# Patient Record
Sex: Female | Born: 1965 | Race: White | Hispanic: No | Marital: Married | Smoking: Former smoker
Health system: Southern US, Community
[De-identification: ages and names within clinical notes are randomized; demographics above are authoritative.]

## PROBLEM LIST (undated history)

## (undated) DIAGNOSIS — M1990 Unspecified osteoarthritis, unspecified site: Secondary | ICD-10-CM

## (undated) DIAGNOSIS — I10 Essential (primary) hypertension: Secondary | ICD-10-CM

## (undated) DIAGNOSIS — R011 Cardiac murmur, unspecified: Secondary | ICD-10-CM

## (undated) DIAGNOSIS — K589 Irritable bowel syndrome without diarrhea: Secondary | ICD-10-CM

## (undated) DIAGNOSIS — T8859XA Other complications of anesthesia, initial encounter: Secondary | ICD-10-CM

## (undated) DIAGNOSIS — K219 Gastro-esophageal reflux disease without esophagitis: Secondary | ICD-10-CM

## (undated) DIAGNOSIS — Z9889 Other specified postprocedural states: Secondary | ICD-10-CM

## (undated) DIAGNOSIS — F329 Major depressive disorder, single episode, unspecified: Secondary | ICD-10-CM

## (undated) DIAGNOSIS — R112 Nausea with vomiting, unspecified: Secondary | ICD-10-CM

## (undated) DIAGNOSIS — F32A Depression, unspecified: Secondary | ICD-10-CM

## (undated) DIAGNOSIS — J189 Pneumonia, unspecified organism: Secondary | ICD-10-CM

## (undated) DIAGNOSIS — F419 Anxiety disorder, unspecified: Secondary | ICD-10-CM

## (undated) DIAGNOSIS — Z8489 Family history of other specified conditions: Secondary | ICD-10-CM

## (undated) DIAGNOSIS — T4145XA Adverse effect of unspecified anesthetic, initial encounter: Secondary | ICD-10-CM

## (undated) HISTORY — PX: BREAST SURGERY: SHX581

## (undated) HISTORY — PX: OTHER SURGICAL HISTORY: SHX169

## (undated) HISTORY — PX: DILATION AND CURETTAGE OF UTERUS: SHX78

---

## 2001-01-02 ENCOUNTER — Other Ambulatory Visit: Admission: RE | Admit: 2001-01-02 | Discharge: 2001-01-02 | Payer: Self-pay | Admitting: Obstetrics and Gynecology

## 2001-11-14 ENCOUNTER — Other Ambulatory Visit: Admission: RE | Admit: 2001-11-14 | Discharge: 2001-11-14 | Payer: Self-pay | Admitting: Obstetrics and Gynecology

## 2002-04-12 ENCOUNTER — Inpatient Hospital Stay (HOSPITAL_COMMUNITY): Admission: AD | Admit: 2002-04-12 | Discharge: 2002-04-12 | Payer: Self-pay | Admitting: Obstetrics and Gynecology

## 2002-04-12 ENCOUNTER — Encounter: Payer: Self-pay | Admitting: Obstetrics and Gynecology

## 2002-04-16 ENCOUNTER — Inpatient Hospital Stay (HOSPITAL_COMMUNITY): Admission: AD | Admit: 2002-04-16 | Discharge: 2002-04-16 | Payer: Self-pay | Admitting: Obstetrics and Gynecology

## 2002-05-11 ENCOUNTER — Inpatient Hospital Stay (HOSPITAL_COMMUNITY): Admission: AD | Admit: 2002-05-11 | Discharge: 2002-05-14 | Payer: Self-pay | Admitting: Obstetrics and Gynecology

## 2002-06-13 ENCOUNTER — Other Ambulatory Visit: Admission: RE | Admit: 2002-06-13 | Discharge: 2002-06-13 | Payer: Self-pay | Admitting: Obstetrics and Gynecology

## 2003-07-03 ENCOUNTER — Other Ambulatory Visit: Admission: RE | Admit: 2003-07-03 | Discharge: 2003-07-03 | Payer: Self-pay | Admitting: Obstetrics and Gynecology

## 2003-11-06 ENCOUNTER — Encounter: Admission: RE | Admit: 2003-11-06 | Discharge: 2003-11-06 | Payer: Self-pay | Admitting: Obstetrics and Gynecology

## 2004-03-20 ENCOUNTER — Emergency Department (HOSPITAL_COMMUNITY): Admission: EM | Admit: 2004-03-20 | Discharge: 2004-03-21 | Payer: Self-pay | Admitting: Emergency Medicine

## 2004-09-21 ENCOUNTER — Other Ambulatory Visit: Admission: RE | Admit: 2004-09-21 | Discharge: 2004-09-21 | Payer: Self-pay | Admitting: Obstetrics and Gynecology

## 2004-11-09 ENCOUNTER — Encounter: Admission: RE | Admit: 2004-11-09 | Discharge: 2004-11-09 | Payer: Self-pay | Admitting: Obstetrics and Gynecology

## 2005-02-26 ENCOUNTER — Encounter: Admission: RE | Admit: 2005-02-26 | Discharge: 2005-02-26 | Payer: Self-pay | Admitting: Obstetrics and Gynecology

## 2005-07-12 ENCOUNTER — Emergency Department (HOSPITAL_COMMUNITY): Admission: EM | Admit: 2005-07-12 | Discharge: 2005-07-12 | Payer: Self-pay | Admitting: Emergency Medicine

## 2005-11-15 ENCOUNTER — Encounter: Admission: RE | Admit: 2005-11-15 | Discharge: 2005-11-15 | Payer: Self-pay | Admitting: Obstetrics and Gynecology

## 2007-02-08 ENCOUNTER — Encounter (INDEPENDENT_AMBULATORY_CARE_PROVIDER_SITE_OTHER): Payer: Self-pay | Admitting: *Deleted

## 2007-02-08 ENCOUNTER — Emergency Department (HOSPITAL_COMMUNITY): Admission: EM | Admit: 2007-02-08 | Discharge: 2007-02-08 | Payer: Self-pay | Admitting: Emergency Medicine

## 2007-03-21 ENCOUNTER — Encounter: Admission: RE | Admit: 2007-03-21 | Discharge: 2007-03-21 | Payer: Self-pay | Admitting: Obstetrics and Gynecology

## 2008-04-02 ENCOUNTER — Encounter: Admission: RE | Admit: 2008-04-02 | Discharge: 2008-04-02 | Payer: Self-pay | Admitting: Obstetrics and Gynecology

## 2008-10-29 DIAGNOSIS — F411 Generalized anxiety disorder: Secondary | ICD-10-CM | POA: Insufficient documentation

## 2008-10-29 DIAGNOSIS — F329 Major depressive disorder, single episode, unspecified: Secondary | ICD-10-CM

## 2008-10-29 DIAGNOSIS — F32A Depression, unspecified: Secondary | ICD-10-CM | POA: Insufficient documentation

## 2008-10-29 DIAGNOSIS — K589 Irritable bowel syndrome without diarrhea: Secondary | ICD-10-CM | POA: Insufficient documentation

## 2009-04-28 ENCOUNTER — Encounter: Admission: RE | Admit: 2009-04-28 | Discharge: 2009-04-28 | Payer: Self-pay | Admitting: Obstetrics and Gynecology

## 2009-09-28 ENCOUNTER — Emergency Department (HOSPITAL_COMMUNITY): Admission: EM | Admit: 2009-09-28 | Discharge: 2009-09-28 | Payer: Self-pay | Admitting: Emergency Medicine

## 2009-11-17 ENCOUNTER — Encounter: Admission: RE | Admit: 2009-11-17 | Discharge: 2009-11-17 | Payer: Self-pay | Admitting: Obstetrics and Gynecology

## 2009-11-18 ENCOUNTER — Encounter: Admission: RE | Admit: 2009-11-18 | Discharge: 2009-11-18 | Payer: Self-pay | Admitting: Obstetrics and Gynecology

## 2010-04-09 LAB — POCT PREGNANCY, URINE: Preg Test, Ur: NEGATIVE

## 2010-06-12 NOTE — Discharge Summary (Signed)
Joy Tucker, Joy Tucker                         ACCOUNT NO.:  [id number]   MEDICAL RECORD NO.:  [id number]                   PATIENT TYPE:  INP   LOCATION:  9104                                 FACILITY:  [facility]   PHYSICIAN:  Michelle L. Vincente Poli, M.D.            DATE OF BIRTH:  1966/01/11   DATE OF ADMISSION:  05/11/2002  DATE OF DISCHARGE:  05/14/2002                                 DISCHARGE SUMMARY   ADMISSION DIAGNOSES:  1. Intrauterine pregnancy at term.  2. Previous cesarean section, declined vaginal birth after cesarean section.  3. Mature lecithin/sphingo-myelin ratio.   DISCHARGE DIAGNOSES:  1. Status post low transverse cesarean section.  2. Viable female infant.   PROCEDURES:  Repeat low transverse cesarean section.   REASON FOR ADMISSION:  Please see the dictated H&P.   HOSPITAL COURSE:  The patient is a 45 year old married female, gravida 2,  para 1, who was admitted to the [facility] for a scheduled  cesarean delivery.  The patient had had a previous cesarean delivery for  cephalopelvic disproportion.  The patient had complained of significant low  back pain and preferred to proceed with a repeat cesarean section at 36-37  weeks.  Amniocentesis was performed to determine fetal lung maturity.  Lecithin/sphingo-myelin ratio revealed a 4.5:1 with prostaglandin.  The  patient was admitted therefore for repeat cesarean section.  On the morning  of admission, the patient was prepped accordingly and taken to the operating  room where spinal anesthesia was administered without difficulty.  A low  transverse cesarean section was made with delivery of a viable female infant  weighing 5 pounds 14 ounces with Apgars of 9 at one minute and 9 at five  minutes.  The patient tolerated the procedure well and was taken to the  recovery room in stable condition.  On postoperative day #1, the patient had  good return of bowel function.  The abdomen was soft.  The fundus  was firm  and nontender.  The abdominal dressing was removed, revealing an incision  that was clean, dry, and intact.  Labs revealed hemoglobin of 10.6, platelet  count of 205,000, and WBC count and 14.9.  On postoperative day #2, the  fundus was firm and nontender.  The incision was clean, dry, and intact with  ecchymosis noted superior to the incisional site.  The patient was  ambulating well and tolerating a regular diet without complaints of nausea  and vomiting.  On postoperative day #3, the patient was doing well.  The  abdomen was soft.  The fundus was firm and nontender.  The incision was  clean, dry, and intact.  Staples were removed and the patient was discharged  home.   CONDITION ON DISCHARGE:  Good.   DIET:  Regular as tolerated.   ACTIVITY:  No heavy lifting.  No driving x 2 weeks.  No vaginal entry.   FOLLOWUP:  The patient  is to follow up in the office in one to two weeks for  an incision check.  She is to call for a temperature greater than 100  degrees, persistent nausea and vomiting, heavy vaginal bleeding, and/or  redness of the incisional site.    DISCHARGE MEDICATIONS:  1. Darvocet-N 100, #30, one p.o. every four to six hours p.r.n.  2. Ibuprofen 600 mg every six hours.  3. Prenatal vitamins one p.o. daily.  4. Colace one p.o. daily p.r.n.     Julio Sicks, N.P.                        Stann Mainland. Vincente Poli, M.D.    CC/MEDQ  D:  06/19/2002  T:  06/19/2002  Job:  [id number]

## 2010-06-12 NOTE — Op Note (Signed)
NAMECORRI, DELAPAZ                         ACCOUNT NO.:  [id number]   MEDICAL RECORD NO.:  [id number]                   PATIENT TYPE:  INP   LOCATION:  9104                                 FACILITY:  [facility]   PHYSICIAN:  Duke Salvia. Marcelle Overlie, M.D.            DATE OF BIRTH:  11/18/1965   DATE OF PROCEDURE:  05/11/2002  DATE OF DISCHARGE:                                 OPERATIVE REPORT   PREOPERATIVE DIAGNOSES:  1. Previous cesarean section, declines vaginal birth after cesarean     delivery.  2. Mature L/S ratio.   POSTOPERATIVE DIAGNOSES:  1. Previous cesarean section, declines vaginal birth after cesarean     delivery.  2. Mature L/S ratio.   PROCEDURE:  Repeat low transverse cesarean section.   SURGEON:  Duke Salvia. Marcelle Overlie, M.D.   ANESTHESIA:  Spinal.   COMPLICATIONS:  None.   DRAINS:  Foley catheter.   ESTIMATED BLOOD LOSS:  700 mL.   PROCEDURE AND FINDINGS:  The patient to the operating room.  After an  adequate level of spinal anesthetic was obtained with the patient supine,  the abdomen was prepped and draped in the usual manner for sterile abdominal  procedures.  The Foley catheter was positioned, draining clear urine.  A  Pfannenstiel incision made through the old scar, which was carried down to  the fascia.  This was incised and extended transversely.  Rectus muscles  divided in the midline.  Peritoneum entered without incident and extended in  a vertical manner.  The vesicouterine serosa was then incised and the  bladder was bluntly and sharply dissected off of the lower uterine segment  and he bladder blade was positioned.  A transverse incision made in the  lower segment, extended with bandage scissors.  Clear fluid was then noted.  The patient delivered of a healthy female in the frank breech presentation  without difficulty.  The infant was suctioned, cord clamped, and passed to  the pediatric team for further care.  The placenta removed manually  intact,  uterus exteriorized, cavity wiped clean with a laparotomy pad.  Closure  obtained with a first layer of 0 chromic in a locked fashion, followed by an  imbricating 0 chromic.  This was hemostatic.  The bladder flap area was  intact and hemostatic, tubes and ovaries were normal.  Prior to closure,  sponge, needle, and instrument counts were reported as correct x2.  Rectus  muscles reapproximated with 2-0 Dexon interrupted sutures, fascia closed  transversely with a 0 PDS suture.  Subcutaneous fat was hemostat.  Clips and  Steri-Strips used on the skin.  A pressure dressing was applied.  She did  receive Pitocin IV after the cord was clamped along with Ancef 1 g IV.  Clear urine noted at the end of the case.  Richard M. Marcelle Overlie, M.D.   RMH/MEDQ  D:  05/11/2002  T:  05/12/2002  Job:  [id number]

## 2010-06-12 NOTE — H&P (Signed)
   Joy Tucker, Joy Tucker                         ACCOUNT NO.:  [id number]   MEDICAL RECORD NO.:  [id number]                   PATIENT TYPE:  INP   LOCATION:  NA                                   [facility]   PHYSICIAN:  Duke Salvia. Marcelle Overlie, M.D.            DATE OF BIRTH:  1965-02-27   DATE OF ADMISSION:  05/11/2002  DATE OF DISCHARGE:                                HISTORY & PHYSICAL   CHIEF COMPLAINT:  For repeat cesarean section, mature LS ratio.   HISTORY OF PRESENT ILLNESS:  The patient is a 45 year old G2, P1, EDD is  06/07/02, EGA is 36 to 37 weeks.  She elected to undergo amniocentesis  yesterday due to significant end of pregnancy back discomfort preferring to  proceed with cesarean section if mature.  LS returned 4.5:1 LPG, and she  presents now for repeat cesarean section.  This procedure, including risk of  bleeding, infection, transfusion, and adjacent organ injury were all  reviewed with her which she understands and accepts.  First cesarean section  was done in 2001 for a CPD, delivering a 6 pound 11 ounce female.  Blood type  is O positive.  Rubella titer is immune.  She does have a history of  irritable bowel syndrome, and has had a difficult time with iron supplements  during this pregnancy.  One hour GTT was 70.   ALLERGIES:  PHENERGAN.   PAST SURGICAL HISTORY:  Cesarean section.   REVIEW OF SYMPTOMS:  Significant for plantar wart that was frozen this  pregnancy.  She has a history of anxiety.   FAMILY HISTORY:  Significant for her mother and father with hypertension.  The patient also has a history of irritable bowel syndrome.  Her mother and  sister who have both been treated for anxiety and depression, and her sister  also has OCD.   PHYSICAL EXAMINATION:  VITAL SIGNS:  Temperature 98.2, blood pressure 90/60.  HEENT:  Unremarkable.  NECK:  Supple without mass.  LUNGS:  Clear.  CARDIOVASCULAR:  Regular rate and rhythm, no murmurs, rubs, or gallops.  BREASTS:  Not examined.  PELVIC:  A 36 cm fundal height.  Fetal heart rate 140.  Cervix is closed.  EXTREMITIES:  Unremarkable.  NEUROLOGIC:  Unremarkable.   IMPRESSION:  1. A 36-1/2 to 37 week intrauterine pregnancy.  2.     Previous cesarean section, declines VBAC.  3. Mature LS ratio.   PLAN:  Repeat cesarean section.  Risks reviewed as above.                                                Richard M. Marcelle Overlie, M.D.    RMH/MEDQ  D:  05/10/2002  T:  05/10/2002  Job:  [id number]

## 2010-07-02 ENCOUNTER — Other Ambulatory Visit: Payer: Self-pay | Admitting: Obstetrics and Gynecology

## 2010-07-02 DIAGNOSIS — Z1231 Encounter for screening mammogram for malignant neoplasm of breast: Secondary | ICD-10-CM

## 2010-07-13 ENCOUNTER — Ambulatory Visit: Payer: Self-pay

## 2010-08-05 ENCOUNTER — Ambulatory Visit
Admission: RE | Admit: 2010-08-05 | Discharge: 2010-08-05 | Disposition: A | Discharge status: Home / Self Care | Payer: BC Managed Care – PPO | Source: Ambulatory Visit | Attending: Obstetrics and Gynecology | Admitting: Obstetrics and Gynecology

## 2010-08-05 DIAGNOSIS — Z1231 Encounter for screening mammogram for malignant neoplasm of breast: Secondary | ICD-10-CM

## 2010-10-15 LAB — URINE MICROSCOPIC-ADD ON

## 2010-10-15 LAB — COMPREHENSIVE METABOLIC PANEL
ALT: 16
Albumin: 4.2
BUN: 7
Calcium: 8.8
Glucose, Bld: 106 — ABNORMAL HIGH
Sodium: 135
Total Protein: 6.7

## 2010-10-15 LAB — CBC
Hemoglobin: 13.5
MCHC: 34.2
Platelets: 191
RDW: 12.5

## 2010-10-15 LAB — URINALYSIS, ROUTINE W REFLEX MICROSCOPIC
Glucose, UA: NEGATIVE
pH: 6

## 2010-10-15 LAB — DIFFERENTIAL
Lymphs Abs: 1.2
Monocytes Absolute: 0.9
Monocytes Relative: 6
Neutro Abs: 11.8 — ABNORMAL HIGH
Neutrophils Relative %: 85 — ABNORMAL HIGH

## 2011-02-08 ENCOUNTER — Other Ambulatory Visit: Payer: Self-pay | Admitting: Dermatology

## 2011-06-08 ENCOUNTER — Emergency Department (HOSPITAL_COMMUNITY)
Admission: EM | Admit: 2011-06-08 | Discharge: 2011-06-08 | Disposition: A | Discharge status: Home / Self Care | Payer: BC Managed Care – PPO | Attending: Emergency Medicine | Admitting: Emergency Medicine

## 2011-06-08 ENCOUNTER — Encounter (HOSPITAL_COMMUNITY): Payer: Self-pay | Admitting: Emergency Medicine

## 2011-06-08 DIAGNOSIS — F411 Generalized anxiety disorder: Secondary | ICD-10-CM | POA: Insufficient documentation

## 2011-06-08 DIAGNOSIS — T7840XA Allergy, unspecified, initial encounter: Secondary | ICD-10-CM | POA: Insufficient documentation

## 2011-06-08 DIAGNOSIS — X58XXXA Exposure to other specified factors, initial encounter: Secondary | ICD-10-CM | POA: Insufficient documentation

## 2011-06-08 DIAGNOSIS — L509 Urticaria, unspecified: Secondary | ICD-10-CM | POA: Insufficient documentation

## 2011-06-08 HISTORY — DX: Anxiety disorder, unspecified: F41.9

## 2011-06-08 MED ORDER — LORAZEPAM 2 MG/ML IJ SOLN
1.0000 mg | Freq: Once | INTRAMUSCULAR | Status: AC
  Administered 2011-06-08: 1 mg via INTRAVENOUS
  Filled 2011-06-08: qty 1

## 2011-06-08 MED ORDER — EPINEPHRINE 0.3 MG/0.3ML IJ DEVI: 0.3000 mg | INTRAMUSCULAR | Status: AC | PRN

## 2011-06-08 MED ORDER — METHYLPREDNISOLONE SODIUM SUCC 125 MG IJ SOLR
125.0000 mg | Freq: Once | INTRAMUSCULAR | Status: AC
  Administered 2011-06-08: 125 mg via INTRAVENOUS
  Filled 2011-06-08: qty 2

## 2011-06-08 MED ORDER — EPINEPHRINE 0.3 MG/0.3ML IJ DEVI: 0.3000 mg | INTRAMUSCULAR | Status: DC | PRN

## 2011-06-08 MED ORDER — FAMOTIDINE 20 MG PO TABS: 20.0000 mg | ORAL_TABLET | Freq: Two times a day (BID) | ORAL | Status: DC

## 2011-06-08 MED ORDER — DIPHENHYDRAMINE HCL 50 MG/ML IJ SOLN
25.0000 mg | Freq: Once | INTRAMUSCULAR | Status: AC
  Administered 2011-06-08: 25 mg via INTRAVENOUS
  Filled 2011-06-08: qty 1

## 2011-06-08 MED ORDER — FAMOTIDINE IN NACL 20-0.9 MG/50ML-% IV SOLN
20.0000 mg | Freq: Once | INTRAVENOUS | Status: AC
  Administered 2011-06-08: 20 mg via INTRAVENOUS
  Filled 2011-06-08: qty 50

## 2011-06-08 MED ORDER — DIPHENHYDRAMINE HCL 50 MG PO CAPS: 50.0000 mg | ORAL_CAPSULE | Freq: Four times a day (QID) | ORAL | Status: DC | PRN

## 2011-06-08 MED ORDER — PREDNISONE 50 MG PO TABS: 50.0000 mg | ORAL_TABLET | Freq: Every day | ORAL | Status: DC

## 2011-06-08 NOTE — Discharge Instructions (Signed)
Hives Hives (urticaria) are itchy, red, swollen patches on the skin. They may change size, shape, and location quickly and repeatedly. Hives that occur deeper in the skin can cause swelling of the hands, feet, and face. Hives may be an allergic reaction to something you or your child ate, touched, or put on the skin. Hives can also be a reaction to cold, heat, viral infections, medication, insect bites, or emotional stress. Often the cause is hard to find. Hives can come and go for several days to several weeks. Hives are not contagious. HOME CARE INSTRUCTIONS   If the cause of the hives is known, avoid exposure to that source.   To relieve itching and rash:   Apply cold compresses to the skin or take cool water baths. Do not take or give your child hot baths or showers because the warmth will make the itching worse.   The best medicine for hives is an antihistamine. An antihistamine will not cure hives, but it will reduce their severity. You can use an antihistamine available over the counter. This medicine may make your child sleepy. Teenagers should not drive while using this medicine.   Take or give an antihistamine every 6 hours until the hives are completely gone for 24 hours or as directed.   Your child may have other medications prescribed for itching. Give these as directed by your child's caregiver.   You or your child should wear loose fitting clothing, including undergarments. Skin irritations may make hives worse.   Follow-up as directed by your caregiver.  SEEK MEDICAL CARE IF:   You or your child still have considerable itching after taking the medication (prescribed or purchased over the counter).   Joint swelling or pain occurs.  SEEK IMMEDIATE MEDICAL CARE IF:   You have a fever.   Swollen lips or tongue are noticed.   There is difficulty with breathing, swallowing, or tightness in the throat or chest.   Abdominal pain develops.   Your child starts acting very  sick.  These may be the first signs of a life-threatening allergic reaction. THIS IS AN EMERGENCY. Call 911 for medical help. MAKE SURE YOU:   Understand these instructions.   Will watch your condition.   Will get help right away if you are not doing well or get worse.  Document Released: 01/11/2005 Document Revised: 12/31/2010 Document Reviewed: 09/01/2007 [facility] Patient Information 2012 [place], [place].Epinephrine Injection Epinephrine is a medicine given by injection to temporarily treat an emergency allergic reaction. It is also used to treat severe asthmatic attacks and other lung problems. The medicine helps to enlarge (dilate) the small breathing tubes of the lungs. A life-threatening, sudden allergic reaction that involves the whole body is called anaphylaxis. Because of potential side effects, epinephrine should only be used as directed by your caregiver. RISKS AND COMPLICATIONS Possible side effects of epinephrine injections include:  Chest pain.   Irregular or rapid heartbeat.   Shortness of breath.   Nausea.   Vomiting.   Abdominal pain or cramping.   Sweating.   Dizziness.   Weakness.   Headache.   Nervousness.  Report all side effects to your caregiver. HOW TO GIVE AN EPINEPHRINE INJECTION Give the epinephrine injection immediately when symptoms of a severe reaction begin. Inject the medicine into the outer thigh or any available, large muscle. Your caregiver can teach you how to do this. You do not need to remove any clothing. After the injection, call your local emergency services (911 in [place]  Even if you improve after the injection, you need to be examined at a hospital emergency department. Epinephrine works quickly, but it also wears off quickly. Delayed reactions can occur. A delayed reaction may be as serious and dangerous as the initial reaction. HOME CARE INSTRUCTIONS  Make sure you and your family know how to give an epinephrine injection.   Use  epinephrine injections as directed by your caregiver. Do not use this medicine more often or in larger doses than prescribed.   Always carry your epinephrine injection or anaphylaxis kit with you. This can be lifesaving if you have a severe reaction.   Store the medicine in a cool, dry place. If the medicine becomes discolored or cloudy, dispose of it properly and replace it with new medicine.   Check the expiration date on your medicine. It may be unsafe to use medicines past their expiration date.   Tell your caregiver about any other medicines you are taking. Some medicines can react badly with epinephrine.   Tell your caregiver about any medical conditions you have, such as diabetes, high blood pressure (hypertension), heart disease, irregular heartbeats, or if you are pregnant.  SEEK IMMEDIATE MEDICAL CARE IF:  You have used an epinephrine injection. Call your local emergency services (911 in [place] Even if you improve after the injection, you need to be examined at a hospital emergency department to make sure your allergic reaction is under control. You will also be monitored for adverse effects from the medicine.   You have chest pain.   You have irregular or fast heartbeats.   You have shortness of breath.   You have severe headaches.   You have severe nausea, vomiting, or abdominal cramps.   You have severe pain, swelling, or redness in the area where you gave the injection.  Document Released: 01/09/2000 Document Revised: 12/31/2010 Document Reviewed: 09/30/2010 [facility] Patient Information 2012 [place], [place].

## 2011-06-08 NOTE — ED Provider Notes (Signed)
History     CSN: [id number]  Arrival date & time 06/08/11  9528   First MD Initiated Contact with Patient 06/08/11 1950      Chief Complaint  Patient presents with  . Allergic Reaction    HPI Pt states she developed an acute allergic reaction this evening.  Pt was eating a spinach pizza at a local establishment.  Pt developed a severe rash and is itching all over.  She went to an urgent care and was given a Benadryl by mouth, Decadron and Depo-Medrol IM, Zantac by mouth and an epinephrine injection x2. The patient states she was having trouble with her breathing earlier but is not having a difficulty now. She states however the itching and rash is still as bad as it ever was and is very severe. Patient feels like she is going to jump out of her skin. She denies any difficulty breathing at this point. She's never had a reaction like this before. She had a reaction to mushrooms in the past but it was not like this.   Past Medical History  Diagnosis Date  . Anxiety     No past surgical history on file.  No family history on file.  History  Substance Use Topics  . Smoking status: Not on file  . Smokeless tobacco: Not on file  . Alcohol Use:     OB History    Grav Para Term Preterm Abortions TAB SAB Ect Mult Living                  Review of Systems  All other systems reviewed and are negative.    Allergies  Other and Promethazine hcl  Home Medications  No current outpatient prescriptions on file.  BP 134/91  Pulse 87  SpO2 97%  Physical Exam  Nursing note and vitals reviewed. Constitutional: She appears well-developed and well-nourished. No distress.  HENT:  Head: Normocephalic and atraumatic.  Right Ear: External ear normal.  Left Ear: External ear normal.  Mouth/Throat: No uvula swelling.  Eyes: Conjunctivae are normal. Right eye exhibits no discharge. Left eye exhibits no discharge. No scleral icterus.  Neck: Neck supple. No tracheal deviation present.    Cardiovascular: Normal rate, regular rhythm and intact distal pulses.   Pulmonary/Chest: Effort normal and breath sounds normal. No stridor. No respiratory distress. She has no wheezes. She has no rales.  Abdominal: Soft. Bowel sounds are normal. She exhibits no distension. There is no tenderness. There is no rebound and no guarding.  Musculoskeletal: She exhibits no edema and no tenderness.  Neurological: She is alert. She has normal strength. No sensory deficit. Cranial nerve deficit:  no gross defecits noted. She exhibits normal muscle tone. She displays no seizure activity. Coordination normal.  Skin: Skin is warm and dry. Rash noted. No bruising and no petechiae noted. Rash is urticarial. Rash is not pustular. She is not diaphoretic. There is erythema.  Psychiatric: Her mood appears anxious.    ED Course  Procedures (including critical care time)  Medications  methylPREDNISolone sodium succinate (SOLU-MEDROL) 125 mg/2 mL injection 125 mg (125 mg Intravenous Given 06/08/11 2010)  diphenhydrAMINE (BENADRYL) injection 25 mg (25 mg Intravenous Given 06/08/11 2010)  LORazepam (ATIVAN) injection 1 mg (1 mg Intravenous Given 06/08/11 2009)  famotidine (PEPCID) IVPB 20 mg (20 mg Intravenous Given 06/08/11 2009)    Labs Reviewed - No data to display No results found.    MDM  The patient has been monitored in the emergency department.  Her symptoms have improved. The rash is decreased in severity and she's not showing any evidence to suggest airway involvement. Patient did have a component of anxiety in this responded well to a dose of Ativan. Patient be discharged on a course of steroids, antihistamines and I will provide her with a prescription for an EpiPen. Recommended she followup with an allergist for continued workup to determine the etiology of this allergic reaction.        Celene Kras, MD 06/08/11 2158

## 2011-06-08 NOTE — ED Notes (Signed)
Pt ate at [employer] pizza (buffet) today and had a spinach pizza. later she began having SOB, feeling faint and hot. She went to [facility] urgent care and was given epi. 0.41mL SQ x 2, Decadron 6mg  IM, Depomedrol 40mg  IM, Zantac 150mg  PO, Benadryl 50mg  PO, RFA 20G IV. Pt's  BP was initially very low. 100/60 97% HR 40

## 2011-06-08 NOTE — ED Notes (Signed)
ZOX:[id number]<AV> Expected date:06/08/11<BR> Expected time:<BR> Means of arrival:<BR> Comments:<BR> EMS 120 GC - allergic reaction

## 2011-10-19 ENCOUNTER — Other Ambulatory Visit: Payer: Self-pay | Admitting: Obstetrics and Gynecology

## 2011-10-19 DIAGNOSIS — Z1231 Encounter for screening mammogram for malignant neoplasm of breast: Secondary | ICD-10-CM

## 2011-10-25 ENCOUNTER — Ambulatory Visit
Admission: RE | Admit: 2011-10-25 | Discharge: 2011-10-25 | Disposition: A | Discharge status: Home / Self Care | Payer: BC Managed Care – PPO | Source: Ambulatory Visit | Attending: Obstetrics and Gynecology | Admitting: Obstetrics and Gynecology

## 2011-10-25 DIAGNOSIS — Z1231 Encounter for screening mammogram for malignant neoplasm of breast: Secondary | ICD-10-CM

## 2012-10-06 ENCOUNTER — Other Ambulatory Visit: Payer: Self-pay | Admitting: Gastroenterology

## 2012-10-06 ENCOUNTER — Ambulatory Visit
Admission: RE | Admit: 2012-10-06 | Discharge: 2012-10-06 | Disposition: A | Discharge status: Home / Self Care | Payer: Managed Care, Other (non HMO) | Source: Ambulatory Visit | Attending: Gastroenterology | Admitting: Gastroenterology

## 2012-10-06 DIAGNOSIS — R109 Unspecified abdominal pain: Secondary | ICD-10-CM

## 2012-10-10 ENCOUNTER — Other Ambulatory Visit: Payer: Self-pay | Admitting: Gastroenterology

## 2012-10-10 DIAGNOSIS — R109 Unspecified abdominal pain: Secondary | ICD-10-CM

## 2012-10-11 ENCOUNTER — Ambulatory Visit
Admission: RE | Admit: 2012-10-11 | Discharge: 2012-10-11 | Disposition: A | Discharge status: Home / Self Care | Payer: Managed Care, Other (non HMO) | Source: Ambulatory Visit | Attending: Gastroenterology | Admitting: Gastroenterology

## 2012-10-11 DIAGNOSIS — R109 Unspecified abdominal pain: Secondary | ICD-10-CM

## 2012-10-11 IMAGING — CR DG ABDOMEN 2V
2 series · 2 of 2 positions shown · non-contrast
Comparison: Ultrasound the abdomen of [DATE]

CLINICAL DATA: Right lower quadrant abdominal pain for 10 days

ABDOMEN - 2 VIEW

[view not recorded (1 of 2)]
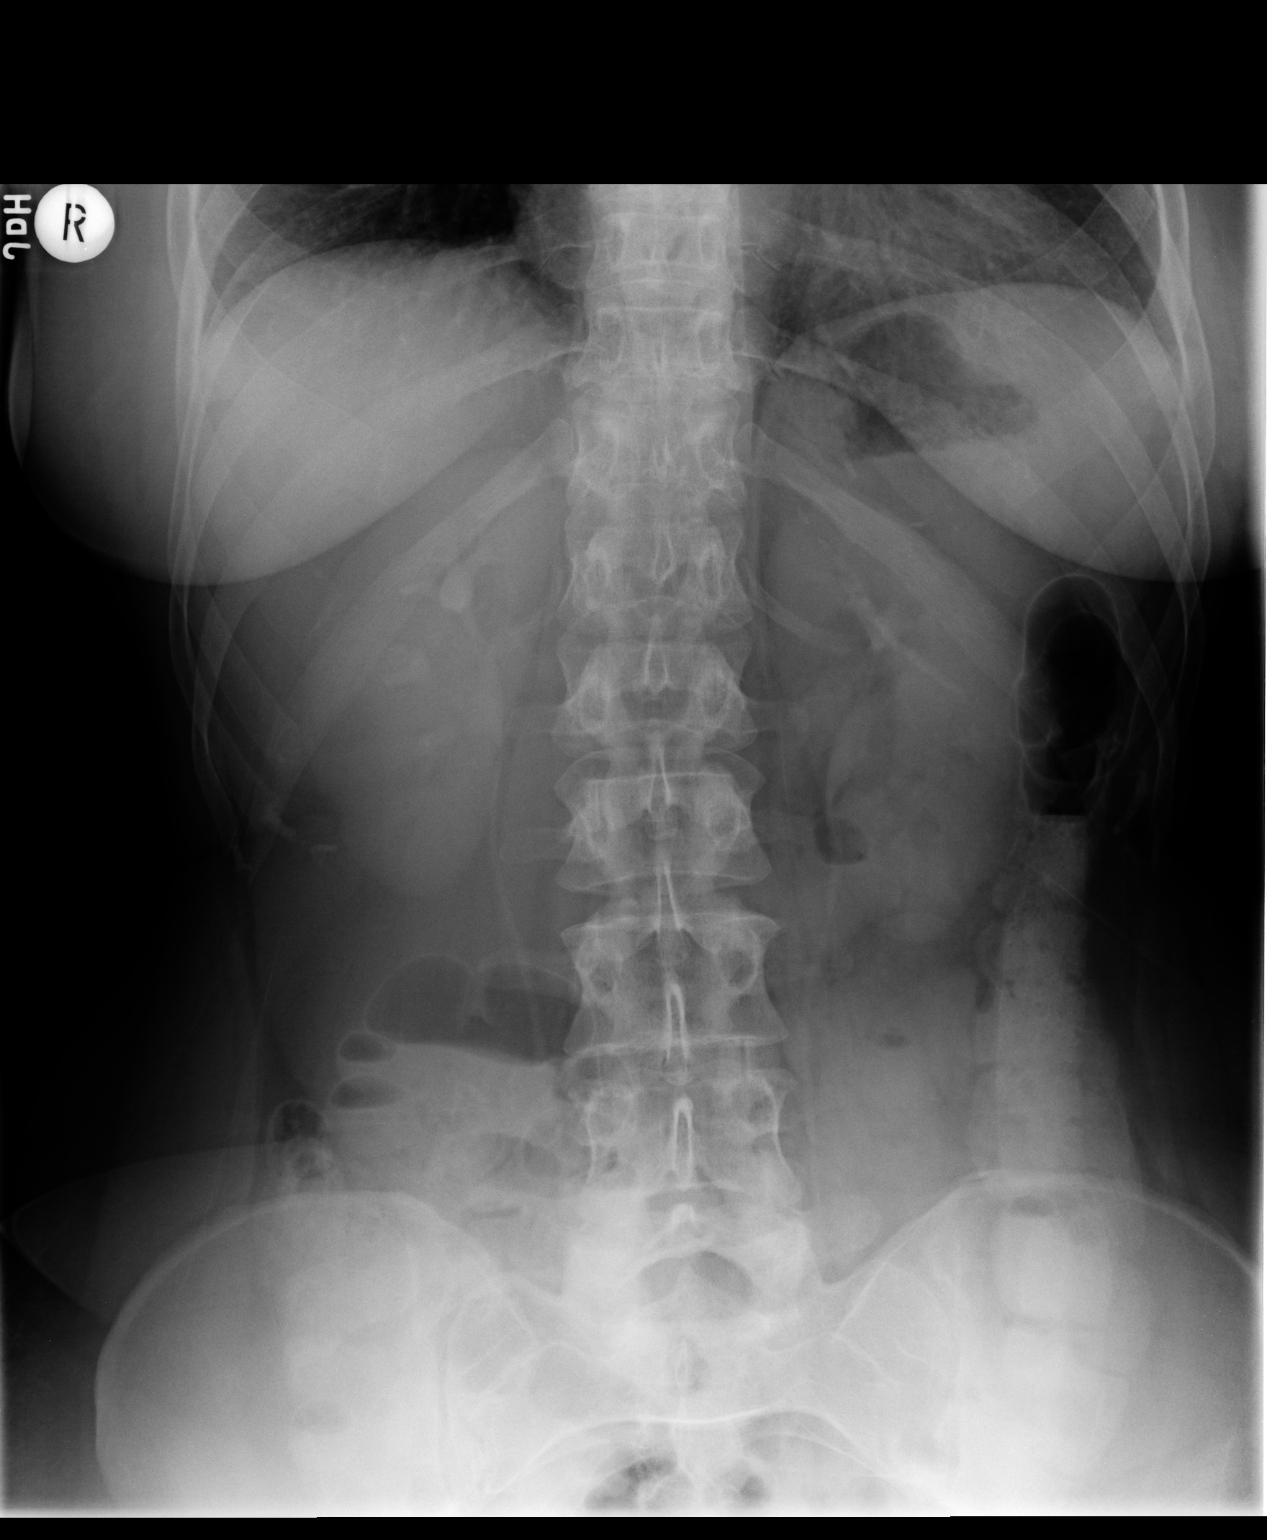

[view not recorded (2 of 2)]
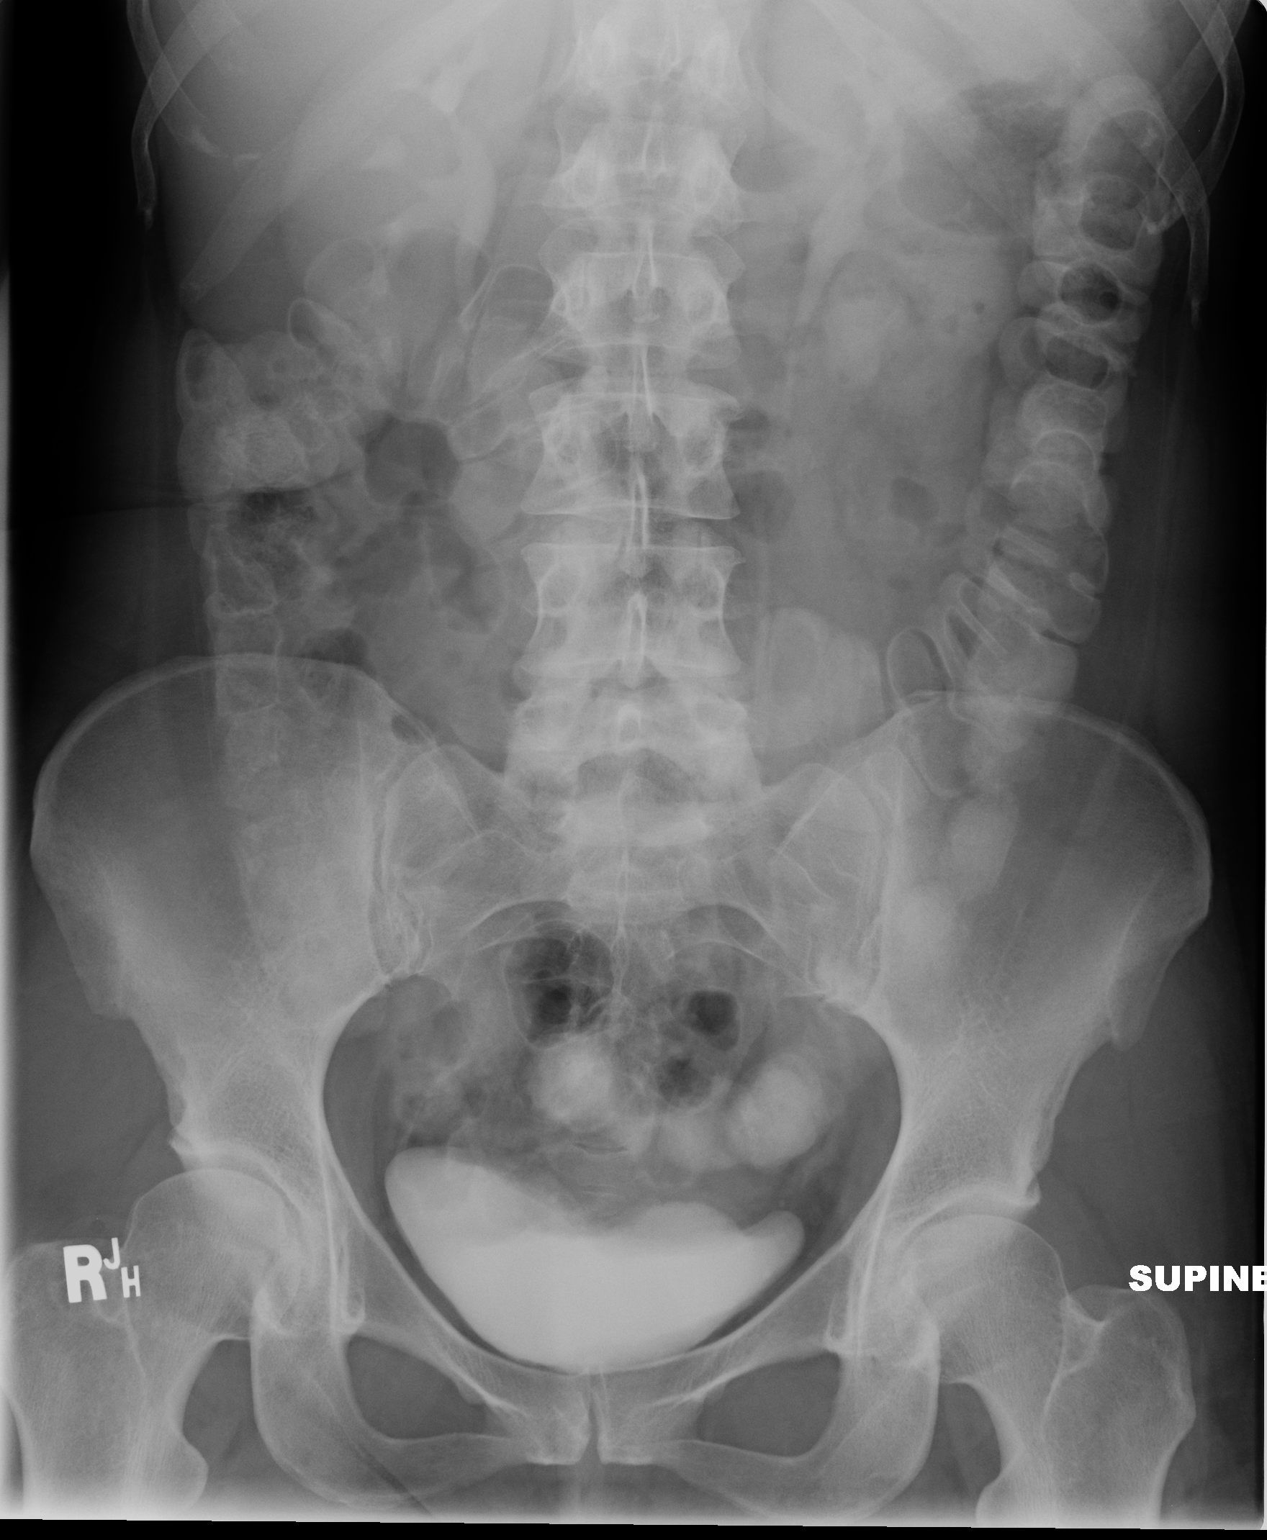

[2 of 2 positions shown; findings below may reference images not displayed]

FINDINGS: Supine and erect views of the abdomen show contrast
scattered throughout the colon with contrast in the pelvocaliceal
systems and urinary bladder is well.  No bowel obstruction is seen
and no free air is noted on the erect view.  No opaque calculi are
seen.
IMPRESSION: Nonspecific bowel gas pattern.

## 2012-10-11 MED ORDER — IOHEXOL 300 MG/ML  SOLN
100.0000 mL | Freq: Once | INTRAMUSCULAR | Status: AC | PRN
  Administered 2012-10-11: 100 mL via INTRAVENOUS

## 2012-10-13 ENCOUNTER — Other Ambulatory Visit: Payer: Self-pay | Admitting: Gastroenterology

## 2012-10-13 DIAGNOSIS — R1031 Right lower quadrant pain: Secondary | ICD-10-CM

## 2012-10-16 ENCOUNTER — Other Ambulatory Visit: Payer: Self-pay | Admitting: Gastroenterology

## 2012-10-16 ENCOUNTER — Ambulatory Visit
Admission: RE | Admit: 2012-10-16 | Discharge: 2012-10-16 | Disposition: A | Discharge status: Home / Self Care | Payer: Managed Care, Other (non HMO) | Source: Ambulatory Visit | Attending: Gastroenterology | Admitting: Gastroenterology

## 2012-10-16 DIAGNOSIS — R1031 Right lower quadrant pain: Secondary | ICD-10-CM

## 2012-10-22 ENCOUNTER — Emergency Department (HOSPITAL_COMMUNITY)
Admission: EM | Admit: 2012-10-22 | Discharge: 2012-10-22 | Disposition: A | Discharge status: Home / Self Care | Payer: BC Managed Care – PPO | Attending: Emergency Medicine | Admitting: Emergency Medicine

## 2012-10-22 ENCOUNTER — Encounter (HOSPITAL_COMMUNITY): Payer: Self-pay | Admitting: Nurse Practitioner

## 2012-10-22 ENCOUNTER — Emergency Department (HOSPITAL_COMMUNITY): Payer: BC Managed Care – PPO

## 2012-10-22 DIAGNOSIS — Z79899 Other long term (current) drug therapy: Secondary | ICD-10-CM | POA: Insufficient documentation

## 2012-10-22 DIAGNOSIS — R1011 Right upper quadrant pain: Secondary | ICD-10-CM | POA: Insufficient documentation

## 2012-10-22 DIAGNOSIS — R109 Unspecified abdominal pain: Secondary | ICD-10-CM

## 2012-10-22 DIAGNOSIS — R112 Nausea with vomiting, unspecified: Secondary | ICD-10-CM | POA: Insufficient documentation

## 2012-10-22 DIAGNOSIS — Z9889 Other specified postprocedural states: Secondary | ICD-10-CM | POA: Insufficient documentation

## 2012-10-22 DIAGNOSIS — F411 Generalized anxiety disorder: Secondary | ICD-10-CM | POA: Insufficient documentation

## 2012-10-22 DIAGNOSIS — F17200 Nicotine dependence, unspecified, uncomplicated: Secondary | ICD-10-CM | POA: Insufficient documentation

## 2012-10-22 DIAGNOSIS — Z8719 Personal history of other diseases of the digestive system: Secondary | ICD-10-CM | POA: Insufficient documentation

## 2012-10-22 DIAGNOSIS — IMO0002 Reserved for concepts with insufficient information to code with codable children: Secondary | ICD-10-CM | POA: Insufficient documentation

## 2012-10-22 DIAGNOSIS — R110 Nausea: Secondary | ICD-10-CM

## 2012-10-22 DIAGNOSIS — Z3202 Encounter for pregnancy test, result negative: Secondary | ICD-10-CM | POA: Insufficient documentation

## 2012-10-22 HISTORY — DX: Irritable bowel syndrome, unspecified: K58.9

## 2012-10-22 LAB — COMPREHENSIVE METABOLIC PANEL
ALT: 21 U/L (ref 0–35)
Alkaline Phosphatase: 54 U/L (ref 39–117)
BUN: 12 mg/dL (ref 6–23)
CO2: 25 mEq/L (ref 19–32)
Calcium: 8.2 mg/dL — ABNORMAL LOW (ref 8.4–10.5)
Chloride: 102 mEq/L (ref 96–112)
GFR calc Af Amer: >90 mL/min (ref >90)
GFR calc non Af Amer: >90 mL/min (ref >90)
Glucose, Bld: 91 mg/dL (ref 70–99)
Potassium: 3.7 mEq/L (ref 3.5–5.1)
Sodium: 137 mEq/L (ref 135–145)
Total Bilirubin: 0.5 mg/dL (ref 0.3–1.2)
Total Protein: 6.8 g/dL (ref 6.0–8.3)

## 2012-10-22 LAB — CBC WITH DIFFERENTIAL/PLATELET
Eosinophils Absolute: 0.2 10*3/uL (ref 0.0–0.7)
Hemoglobin: 12.8 g/dL (ref 12.0–15.0)
Lymphocytes Relative: 33 % (ref 12–46)
Lymphs Abs: 1.5 10*3/uL (ref 0.7–4.0)
MCH: 34 pg (ref 26.0–34.0)
Monocytes Relative: 10 % (ref 3–12)
Neutro Abs: 2.4 10*3/uL (ref 1.7–7.7)
Neutrophils Relative %: 53 % (ref 43–77)
Platelets: 162 10*3/uL (ref 150–400)
RBC: 3.77 MIL/uL — ABNORMAL LOW (ref 3.87–5.11)
WBC: 4.6 10*3/uL (ref 4.0–10.5)

## 2012-10-22 LAB — URINALYSIS, ROUTINE W REFLEX MICROSCOPIC
Bilirubin Urine: NEGATIVE
Hgb urine dipstick: NEGATIVE
Ketones, ur: NEGATIVE mg/dL
Leukocytes, UA: NEGATIVE
Protein, ur: NEGATIVE mg/dL
Specific Gravity, Urine: 1.016 (ref 1.005–1.030)
Urobilinogen, UA: 0.2 mg/dL (ref 0.0–1.0)

## 2012-10-22 MED ORDER — SODIUM CHLORIDE 0.9 % IV BOLUS (SEPSIS)
1000.0000 mL | Freq: Once | INTRAVENOUS | Status: AC
  Administered 2012-10-22: 1000 mL via INTRAVENOUS

## 2012-10-22 MED ORDER — OXYCODONE-ACETAMINOPHEN 5-325 MG PO TABS: 1.0000 | ORAL_TABLET | ORAL | Status: DC | PRN

## 2012-10-22 MED ORDER — ONDANSETRON 4 MG PO TBDP: 4.0000 mg | ORAL_TABLET | Freq: Three times a day (TID) | ORAL | Status: DC | PRN

## 2012-10-22 MED ORDER — HYDROMORPHONE HCL PF 1 MG/ML IJ SOLN
1.0000 mg | Freq: Once | INTRAMUSCULAR | Status: AC
  Administered 2012-10-22: 1 mg via INTRAVENOUS
  Filled 2012-10-22: qty 1

## 2012-10-22 MED ORDER — ONDANSETRON HCL 4 MG/2ML IJ SOLN
4.0000 mg | Freq: Once | INTRAMUSCULAR | Status: AC
  Administered 2012-10-22: 4 mg via INTRAVENOUS
  Filled 2012-10-22: qty 2

## 2012-10-22 MED ORDER — ALPRAZOLAM 0.5 MG PO TABS
0.5000 mg | ORAL_TABLET | Freq: Once | ORAL | Status: AC
  Administered 2012-10-22: 0.5 mg via ORAL
  Filled 2012-10-22: qty 1

## 2012-10-22 NOTE — ED Provider Notes (Signed)
CSN: [id number]     Arrival date & time 10/22/12  1258 History   First MD Initiated Contact with Patient 10/22/12 1310     Chief Complaint  Patient presents with  . Abdominal Pain   (Consider location/radiation/quality/duration/timing/severity/associated sxs/prior Treatment) Patient is a 47 y.o. female presenting with abdominal pain. The history is provided by the patient and medical records.  Abdominal Pain Associated symptoms: nausea and vomiting    Pt with PMH signficant for anxiety and IBS presents to the ED for abdominal pain.  Pt has had extensive work-up for this including CT scan, abd u/s, small bowel follow through, and GYN testing this month without diagnosis.  Pt was noted to have hepatomegaly and fibroids but this was not thought to be causing her sx.  Pt is followed by Dr. Kinnie Scales.  Pt has a FU appt with him in 2 days but states she could not wait that long, that the pain was too intense.  Pain localized to right upper quadrant with radiation to her back. Patient notes that eating fatty/greasy foods tend to exacerbate her symptoms, however she has not eaten any in the past 3 weeks because she was warned about this by Dr. Kinnie Scales.  Admits to some nausea and one episode of nonbloody, nonbilious vomiting yesterday. No vomiting today. No recent fevers, sweats, or chills.  No urinary symptoms or vaginal complaints.  No chest pain or SOB.  Past Medical History  Diagnosis Date  . Anxiety   . IBS (irritable bowel syndrome)    Past Surgical History  Procedure Laterality Date  . Cesarean section     History reviewed. No pertinent family history. History  Substance Use Topics  . Smoking status: Current Every Day Smoker    Types: Cigarettes  . Smokeless tobacco: Not on file  . Alcohol Use: Yes   OB History   Grav Para Term Preterm Abortions TAB SAB Ect Mult Living                 Review of Systems  Gastrointestinal: Positive for nausea, vomiting and abdominal pain.  All other  systems reviewed and are negative.    Allergies  Other and Promethazine hcl  Home Medications   Current Outpatient Rx  Name  Route  Sig  Dispense  Refill  . ALPRAZolam (XANAX) 0.5 MG tablet   Oral   Take 0.5 mg by mouth 2 (two) times daily as needed. For anxiety.         Marland Kitchen buPROPion (WELLBUTRIN XL) 150 MG 24 hr tablet   Oral   Take 150 mg by mouth daily.         Marland Kitchen dexamethasone (DECADRON) 6 MG tablet   Oral   Take 6 mg by mouth once.         . diphenhydrAMINE (BENADRYL) 50 MG capsule   Oral   Take 1 capsule (50 mg total) by mouth every 6 (six) hours as needed for itching or allergies.   30 capsule   0   . EPINEPHrine (EPIPEN) 0.3 mg/0.3 mL DEVI   Intramuscular   Inject 0.3 mLs (0.3 mg total) into the muscle as needed (for severe allergic reaction).   3 Device   1   . escitalopram (LEXAPRO) 10 MG tablet   Oral   Take 10 mg by mouth daily.         Marland Kitchen EXPIRED: famotidine (PEPCID) 20 MG tablet   Oral   Take 1 tablet (20 mg total) by mouth 2 (  two) times daily.   14 tablet   0   . methylPREDNISolone acetate (DEPO-MEDROL) 40 MG/ML injection   Intramuscular   Inject 40 mg into the muscle once.         . predniSONE (DELTASONE) 50 MG tablet   Oral   Take 1 tablet (50 mg total) by mouth daily.   5 tablet   0   . ranitidine (ZANTAC) 150 MG tablet   Oral   Take 150 mg by mouth once.          BP 118/79  Pulse 77  Temp(Src) 97.8 F (36.6 C) (Oral)  Resp 20  Ht 5' 4.75" (1.645 m)  Wt 148 lb (67.132 kg)  BMI 24.81 kg/m2  SpO2 98%  LMP 09/13/2012  Physical Exam  Nursing note and vitals reviewed. Constitutional: She is oriented to person, place, and time. She appears well-developed and well-nourished. No distress.  HENT:  Head: Normocephalic and atraumatic.  Mouth/Throat: Oropharynx is clear and moist.  Eyes: Conjunctivae and EOM are normal. Pupils are equal, round, and reactive to light.  Neck: Normal range of motion. Neck supple.   Cardiovascular: Normal rate, regular rhythm and normal heart sounds.   Pulmonary/Chest: Effort normal and breath sounds normal. No respiratory distress. She has no wheezes.  Abdominal: Soft. Bowel sounds are normal. There is tenderness in the right upper quadrant. There is guarding and positive Murphy's sign. There is no rigidity, no CVA tenderness and no tenderness at McBurney's point.  Musculoskeletal: Normal range of motion. She exhibits no edema.  Neurological: She is alert and oriented to person, place, and time.  Skin: Skin is warm and dry. She is not diaphoretic.  Psychiatric: She has a normal mood and affect.    ED Course  Procedures (including critical care time) Labs Review Labs Reviewed  CBC WITH DIFFERENTIAL - Abnormal; Notable for the following:    RBC 3.77 (*)    All other components within normal limits  COMPREHENSIVE METABOLIC PANEL - Abnormal; Notable for the following:    Calcium 8.2 (*)    All other components within normal limits  LIPASE, BLOOD  URINALYSIS, ROUTINE W REFLEX MICROSCOPIC  POCT PREGNANCY, URINE   Imaging Review No results found.  MDM   1. Abdominal  pain, other specified site   2. Nausea     Labs as above, largely WNL, unchanged from previous.  Bili WNL.  Called radiology, HIDA techs are not available this weekend.  This is likely the next test pt will need, however i do not feel it is emergent at this time.  3:12 PM Pt reassessed, lying comfortably in bed, NAD.  Began to discuss lab results with pt, she became hysterical, crying loudly, and flailing around in her bed.  Husband attempted to calm her which only made matters worse.  She is requesting medications for anxiety.  Takes xanax at home, will give one here.  Pts pain has been ongoing for 3 weeks without explanation, labs reassuring in the ED today.  I doubt acute/surgical pathology at this time.  Pt afebrile, non-toxic appearing, NAD, VS stable- ok for discharge.  Pt will be given pain  meds and anti-emetics for home.  FU with Dr. Kinnie Scales on Tuesday 9/30 as previously scheduled.  Discussed plan with pt, she agreed.  Return precautions advised.   Discussed with Dr. Rubin Payor who agrees with assessment and plan of care.  Garlon Hatchet, PA-C 10/22/12 1547

## 2012-10-22 NOTE — ED Notes (Signed)
Pt is being followed by Dr Kinnie Scales for abd pain x 3 weeks. Pt was also checked by obgyn for same pain. Reports pain is too severe this weekend and she does not have another appt with dr Kinnie Scales until Tuesday. Pt has had multiple diagnostic tests so far with no diagnosis

## 2012-10-22 NOTE — ED Provider Notes (Signed)
Medical screening examination/treatment/procedure(s) were performed by non-physician practitioner and as supervising physician I was immediately available for consultation/collaboration.  Perseus Westall R. Bohden Dung, MD 10/22/12 2002 

## 2012-10-25 ENCOUNTER — Other Ambulatory Visit (HOSPITAL_COMMUNITY): Payer: Self-pay | Admitting: Gastroenterology

## 2012-10-25 DIAGNOSIS — R112 Nausea with vomiting, unspecified: Secondary | ICD-10-CM

## 2012-10-25 DIAGNOSIS — R1011 Right upper quadrant pain: Secondary | ICD-10-CM

## 2012-10-27 ENCOUNTER — Encounter (HOSPITAL_COMMUNITY)
Admission: RE | Admit: 2012-10-27 | Discharge: 2012-10-27 | Disposition: A | Discharge status: Home / Self Care | Payer: Managed Care, Other (non HMO) | Source: Ambulatory Visit | Attending: Gastroenterology | Admitting: Gastroenterology

## 2012-10-27 DIAGNOSIS — R7402 Elevation of levels of lactic acid dehydrogenase (LDH): Secondary | ICD-10-CM | POA: Insufficient documentation

## 2012-10-27 DIAGNOSIS — R112 Nausea with vomiting, unspecified: Secondary | ICD-10-CM | POA: Insufficient documentation

## 2012-10-27 DIAGNOSIS — R1011 Right upper quadrant pain: Secondary | ICD-10-CM | POA: Insufficient documentation

## 2012-10-27 DIAGNOSIS — R7401 Elevation of levels of liver transaminase levels: Secondary | ICD-10-CM | POA: Insufficient documentation

## 2012-10-27 MED ORDER — TECHNETIUM TC 99M MEBROFENIN IV KIT
5.0000 | PACK | Freq: Once | INTRAVENOUS | Status: AC | PRN
  Administered 2012-10-27: 5 via INTRAVENOUS

## 2012-10-27 MED ORDER — SINCALIDE 5 MCG IJ SOLR
0.0200 ug/kg | Freq: Once | INTRAMUSCULAR | Status: AC
  Administered 2012-10-27: 3.36 ug via INTRAVENOUS

## 2012-11-07 ENCOUNTER — Other Ambulatory Visit (HOSPITAL_COMMUNITY): Payer: BC Managed Care – PPO

## 2012-11-09 ENCOUNTER — Other Ambulatory Visit: Payer: Self-pay

## 2012-11-09 DIAGNOSIS — Z1231 Encounter for screening mammogram for malignant neoplasm of breast: Secondary | ICD-10-CM

## 2012-12-07 ENCOUNTER — Ambulatory Visit
Admission: RE | Admit: 2012-12-07 | Discharge: 2012-12-07 | Disposition: A | Discharge status: Home / Self Care | Payer: Managed Care, Other (non HMO) | Source: Ambulatory Visit

## 2012-12-07 DIAGNOSIS — Z1231 Encounter for screening mammogram for malignant neoplasm of breast: Secondary | ICD-10-CM

## 2014-01-04 ENCOUNTER — Other Ambulatory Visit: Payer: Self-pay

## 2014-01-04 DIAGNOSIS — Z1231 Encounter for screening mammogram for malignant neoplasm of breast: Secondary | ICD-10-CM

## 2014-07-30 ENCOUNTER — Other Ambulatory Visit: Payer: Self-pay | Admitting: Obstetrics and Gynecology

## 2014-07-31 LAB — CYTOLOGY - PAP

## 2016-01-15 DIAGNOSIS — N6310 Unspecified lump in the right breast, unspecified quadrant: Secondary | ICD-10-CM | POA: Insufficient documentation

## 2017-04-08 DIAGNOSIS — M1712 Unilateral primary osteoarthritis, left knee: Secondary | ICD-10-CM | POA: Insufficient documentation

## 2017-04-08 DIAGNOSIS — M25562 Pain in left knee: Secondary | ICD-10-CM | POA: Insufficient documentation

## 2017-05-23 DIAGNOSIS — Z20828 Contact with and (suspected) exposure to other viral communicable diseases: Secondary | ICD-10-CM | POA: Insufficient documentation

## 2017-05-23 NOTE — Patient Instructions (Addendum)
Joy Tucker  05/23/2017   Your procedure is scheduled on: Thursday 06/02/2017  Report to [facility] Main  Entrance              Report to admitting at  0915 AM    Call this number if you have problems the morning of surgery [phone]    Remember: Do not eat food or drink liquids :After Midnight.     Take these medicines the morning of surgery with A SIP OF WATER:  Bupropion (Wellbutrin XL), Escitalopram (Lexapro), Xanax if needed                                You may not have any metal on your body including hair pins and              piercings  Do not wear jewelry, make-up, lotions, powders or perfumes, deodorant             Do not wear nail polish.  Do not shave  48 hours prior to surgery.             .   Do not bring valuables to the hospital. [facility] IS NOT             RESPONSIBLE   FOR VALUABLES.  Contacts, dentures or bridgework may not be worn into surgery.  Leave suitcase in the car. After surgery it may be brought to your room.                  Please read over the following fact sheets you were given: _____________________________________________________________________             [facility] - Preparing for Surgery Before surgery, you can play an important role.  Because skin is not sterile, your skin needs to be as free of germs as possible.  You can reduce the number of germs on your skin by washing with CHG (chlorahexidine gluconate) soap before surgery.  CHG is an antiseptic cleaner which kills germs and bonds with the skin to continue killing germs even after washing. Please DO NOT use if you have an allergy to CHG or antibacterial soaps.  If your skin becomes reddened/irritated stop using the CHG and inform your nurse when you arrive at Short Stay. Do not shave (including legs and underarms) for at least 48 hours prior to the first CHG shower.  You may shave your face/neck. Please follow these instructions carefully:  1.   Shower with CHG Soap the night before surgery and the  morning of Surgery.  2.  If you choose to wash your hair, wash your hair first as usual with your  normal  shampoo.  3.  After you shampoo, rinse your hair and body thoroughly to remove the  shampoo.                           4.  Use CHG as you would any other liquid soap.  You can apply chg directly  to the skin and wash                       Gently with a scrungie or clean washcloth.  5.  Apply the CHG Soap to your body ONLY FROM THE NECK DOWN.  Do not use on face/ open                           Wound or open sores. Avoid contact with eyes, ears mouth and genitals (private parts).                       Wash face,  Genitals (private parts) with your normal soap.             6.  Wash thoroughly, paying special attention to the area where your surgery  will be performed.  7.  Thoroughly rinse your body with warm water from the neck down.  8.  DO NOT shower/wash with your normal soap after using and rinsing off  the CHG Soap.                9.  Pat yourself dry with a clean towel.            10.  Wear clean pajamas.            11.  Place clean sheets on your bed the night of your first shower and do not  sleep with pets. Day of Surgery : Do not apply any lotions/deodorants the morning of surgery.  Please wear clean clothes to the hospital/surgery center.  FAILURE TO FOLLOW THESE INSTRUCTIONS MAY RESULT IN THE CANCELLATION OF YOUR SURGERY PATIENT SIGNATURE_________________________________  NURSE SIGNATURE__________________________________  ________________________________________________________________________   Adam Phenix  An incentive spirometer is a tool that can help keep your lungs clear and active. This tool measures how well you are filling your lungs with each breath. Taking long deep breaths may help reverse or decrease the chance of developing breathing (pulmonary) problems (especially infection) following:  A long  period of time when you are unable to move or be active. BEFORE THE PROCEDURE   If the spirometer includes an indicator to show your best effort, your nurse or respiratory therapist will set it to a desired goal.  If possible, sit up straight or lean slightly forward. Try not to slouch.  Hold the incentive spirometer in an upright position. INSTRUCTIONS FOR USE  1. Sit on the edge of your bed if possible, or sit up as far as you can in bed or on a chair. 2. Hold the incentive spirometer in an upright position. 3. Breathe out normally. 4. Place the mouthpiece in your mouth and seal your lips tightly around it. 5. Breathe in slowly and as deeply as possible, raising the piston or the ball toward the top of the column. 6. Hold your breath for 3-5 seconds or for as long as possible. Allow the piston or ball to fall to the bottom of the column. 7. Remove the mouthpiece from your mouth and breathe out normally. 8. Rest for a few seconds and repeat Steps 1 through 7 at least 10 times every 1-2 hours when you are awake. Take your time and take a few normal breaths between deep breaths. 9. The spirometer may include an indicator to show your best effort. Use the indicator as a goal to work toward during each repetition. 10. After each set of 10 deep breaths, practice coughing to be sure your lungs are clear. If you have an incision (the cut made at the time of surgery), support your incision when coughing by placing a pillow or rolled up towels firmly against it. Once you are able to get out of  bed, walk around indoors and cough well. You may stop using the incentive spirometer when instructed by your caregiver.  RISKS AND COMPLICATIONS  Take your time so you do not get dizzy or light-headed.  If you are in pain, you may need to take or ask for pain medication before doing incentive spirometry. It is harder to take a deep breath if you are having pain. AFTER USE  Rest and breathe slowly and  easily.  It can be helpful to keep track of a log of your progress. Your caregiver can provide you with a simple table to help with this. If you are using the spirometer at home, follow these instructions: SEEK MEDICAL CARE IF:   You are having difficultly using the spirometer.  You have trouble using the spirometer as often as instructed.  Your pain medication is not giving enough relief while using the spirometer.  You develop fever of 100.5 F (38.1 C) or higher. SEEK IMMEDIATE MEDICAL CARE IF:   You cough up bloody sputum that had not been present before.  You develop fever of 102 F (38.9 C) or greater.  You develop worsening pain at or near the incision site. MAKE SURE YOU:   Understand these instructions.  Will watch your condition.  Will get help right away if you are not doing well or get worse. Document Released: 05/24/2006 Document Revised: 04/05/2011 Document Reviewed: 07/25/2006 [employer] Patient Information 2014 [employer], [place].   ________________________________________________________________________  WHAT IS A BLOOD TRANSFUSION? Blood Transfusion Information  A transfusion is the replacement of blood or some of its parts. Blood is made up of multiple cells which provide different functions.  Red blood cells carry oxygen and are used for blood loss replacement.  White blood cells fight against infection.  Platelets control bleeding.  Plasma helps clot blood.  Other blood products are available for specialized needs, such as hemophilia or other clotting disorders. BEFORE THE TRANSFUSION  Who gives blood for transfusions?   Healthy volunteers who are fully evaluated to make sure their blood is safe. This is blood bank blood. Transfusion therapy is the safest it has ever been in the practice of medicine. Before blood is taken from a donor, a complete history is taken to make sure that person has no history of diseases nor engages in risky social  behavior (examples are intravenous drug use or sexual activity with multiple partners). The donor's travel history is screened to minimize risk of transmitting infections, such as malaria. The donated blood is tested for signs of infectious diseases, such as HIV and hepatitis. The blood is then tested to be sure it is compatible with you in order to minimize the chance of a transfusion reaction. If you or a relative donates blood, this is often done in anticipation of surgery and is not appropriate for emergency situations. It takes many days to process the donated blood. RISKS AND COMPLICATIONS Although transfusion therapy is very safe and saves many lives, the main dangers of transfusion include:   Getting an infectious disease.  Developing a transfusion reaction. This is an allergic reaction to something in the blood you were given. Every precaution is taken to prevent this. The decision to have a blood transfusion has been considered carefully by your caregiver before blood is given. Blood is not given unless the benefits outweigh the risks. AFTER THE TRANSFUSION  Right after receiving a blood transfusion, you will usually feel much better and more energetic. This is especially true if your red blood  cells have gotten low (anemic). The transfusion raises the level of the red blood cells which carry oxygen, and this usually causes an energy increase.  The nurse administering the transfusion will monitor you carefully for complications. HOME CARE INSTRUCTIONS  No special instructions are needed after a transfusion. You may find your energy is better. Speak with your caregiver about any limitations on activity for underlying diseases you may have. SEEK MEDICAL CARE IF:   Your condition is not improving after your transfusion.  You develop redness or irritation at the intravenous (IV) site. SEEK IMMEDIATE MEDICAL CARE IF:  Any of the following symptoms occur over the next 12 hours:  Shaking  chills.  You have a temperature by mouth above 102 F (38.9 C), not controlled by medicine.  Chest, back, or muscle pain.  People around you feel you are not acting correctly or are confused.  Shortness of breath or difficulty breathing.  Dizziness and fainting.  You get a rash or develop hives.  You have a decrease in urine output.  Your urine turns a dark color or changes to pink, red, or brown. Any of the following symptoms occur over the next 10 days:  You have a temperature by mouth above 102 F (38.9 C), not controlled by medicine.  Shortness of breath.  Weakness after normal activity.  The white part of the eye turns yellow (jaundice).  You have a decrease in the amount of urine or are urinating less often.  Your urine turns a dark color or changes to pink, red, or brown. Document Released: 01/09/2000 Document Revised: 04/05/2011 Document Reviewed: 08/28/2007 [facility] Patient Information 2014 [place], [place].  _______________________________________________________________________

## 2017-05-24 ENCOUNTER — Other Ambulatory Visit: Payer: Self-pay

## 2017-05-24 ENCOUNTER — Encounter (HOSPITAL_COMMUNITY): Payer: Self-pay | Admitting: *Deleted

## 2017-05-24 ENCOUNTER — Encounter (HOSPITAL_COMMUNITY)
Admission: RE | Admit: 2017-05-24 | Discharge: 2017-05-24 | Disposition: A | Discharge status: Home / Self Care | Payer: Worker's Compensation | Source: Ambulatory Visit | Attending: Orthopedic Surgery | Admitting: Orthopedic Surgery

## 2017-05-24 DIAGNOSIS — M1712 Unilateral primary osteoarthritis, left knee: Secondary | ICD-10-CM | POA: Diagnosis not present

## 2017-05-24 DIAGNOSIS — Z0183 Encounter for blood typing: Secondary | ICD-10-CM | POA: Diagnosis not present

## 2017-05-24 DIAGNOSIS — Z01812 Encounter for preprocedural laboratory examination: Secondary | ICD-10-CM | POA: Diagnosis not present

## 2017-05-24 HISTORY — DX: Major depressive disorder, single episode, unspecified: F32.9

## 2017-05-24 HISTORY — DX: Unspecified osteoarthritis, unspecified site: M19.90

## 2017-05-24 HISTORY — DX: Adverse effect of unspecified anesthetic, initial encounter: T41.45XA

## 2017-05-24 HISTORY — DX: Gastro-esophageal reflux disease without esophagitis: K21.9

## 2017-05-24 HISTORY — DX: Other complications of anesthesia, initial encounter: T88.59XA

## 2017-05-24 HISTORY — DX: Depression, unspecified: F32.A

## 2017-05-24 HISTORY — DX: Other specified postprocedural states: Z98.890

## 2017-05-24 HISTORY — DX: Family history of other specified conditions: Z84.89

## 2017-05-24 HISTORY — DX: Other specified postprocedural states: R11.2

## 2017-05-24 LAB — CBC
HEMATOCRIT: 41.4 % (ref 36.0–46.0)
HEMOGLOBIN: 13.9 g/dL (ref 12.0–15.0)
MCH: 34.2 pg — ABNORMAL HIGH (ref 26.0–34.0)
MCHC: 33.6 g/dL (ref 30.0–36.0)
MCV: 102 fL — ABNORMAL HIGH (ref 78.0–100.0)
Platelets: 188 10*3/uL (ref 150–400)
RBC: 4.06 MIL/uL (ref 3.87–5.11)
RDW: 12.4 % (ref 11.5–15.5)
WBC: 5.9 10*3/uL (ref 4.0–10.5)

## 2017-05-24 LAB — BASIC METABOLIC PANEL
ANION GAP: 10 (ref 5–15)
BUN: 18 mg/dL (ref 6–20)
CO2: 27 mmol/L (ref 22–32)
Calcium: 9.5 mg/dL (ref 8.9–10.3)
Chloride: 102 mmol/L (ref 101–111)
Creatinine, Ser: 0.82 mg/dL (ref 0.44–1.00)
GLUCOSE: 105 mg/dL — AB (ref 65–99)
POTASSIUM: 5.4 mmol/L — AB (ref 3.5–5.1)
Sodium: 139 mmol/L (ref 135–145)

## 2017-05-24 LAB — SURGICAL PCR SCREEN
MRSA, PCR: NEGATIVE
STAPHYLOCOCCUS AUREUS: NEGATIVE

## 2017-05-24 LAB — ABO/RH: ABO/RH(D): O POS

## 2017-05-26 NOTE — H&P (Signed)
TOTAL KNEE ADMISSION H&P  Patient is being admitted for left total knee arthroplasty.  Subjective:  Chief Complaint:  Left knee primary OA / pain  HPI: Joy Tucker, 52 y.o. female, has a history of pain and functional disability in the left knee due to arthritis and has failed non-surgical conservative treatments for greater than 12 weeks to include NSAID's and/or analgesics, corticosteriod injections, viscosupplementation injections, use of assistive devices and activity modification.  Onset of symptoms was gradual, starting ~1 year ago with gradually worsening course since that time. The patient noted prior procedures on the knee to include  arthroscopy and menisectomy on the left knee(s).  Patient currently rates pain in the left knee(s) at 10 out of 10 with activity. Patient has night pain, worsening of pain with activity and weight bearing, pain that interferes with activities of daily living, pain with passive range of motion, crepitus and joint swelling.  Patient has evidence of periarticular osteophytes and joint space narrowing by imaging studies. There is no active infection.  Risks, benefits and expectations were discussed with the patient.  Risks including but not limited to the risk of anesthesia, blood clots, nerve damage, blood vessel damage, failure of the prosthesis, infection and up to and including death.  Patient understand the risks, benefits and expectations and wishes to proceed with surgery.   PCP: Patient, No Pcp Per  D/C Plans:       Home  Post-op Meds:       No Rx given   Tranexamic Acid:      To be given - IV   Decadron:      Is to be given  FYI:      ASA  Norco  DME:   Rx given for - RW   PT:   OPPT Rx given   Patient Active Problem List   Diagnosis Date Noted  . ANXIETY 10/29/2008  . DEPRESSION 10/29/2008  . IRRITABLE BOWEL SYNDROME 10/29/2008   Past Medical History:  Diagnosis Date  . Anxiety   . Arthritis   . Complication of anesthesia   .  Depression   . Family history of adverse reaction to anesthesia    nausea and vomiting  . GERD (gastroesophageal reflux disease)   . IBS (irritable bowel syndrome)   . IBS (irritable bowel syndrome)   . PONV (postoperative nausea and vomiting)     Past Surgical History:  Procedure Laterality Date  . BREAST SURGERY     x2 -left biopsy-benign  . CESAREAN SECTION  [id number]  . DILATION AND CURETTAGE OF UTERUS    . meniscus knee repair      No current facility-administered medications for this encounter.    Current Outpatient Medications  Medication Sig Dispense Refill Last Dose  . Alpha-D-Galactosidase (BEANO PO) Take 1 tablet by mouth as needed (for gas).    10/21/2012 at Unknown  . ALPRAZolam (XANAX) 0.5 MG tablet Take 0.25 mg by mouth daily as needed for anxiety.    10/22/2012 at Unknown  . APPLE CIDER VINEGAR PO Take 1 capsule by mouth daily.     Marland Kitchen buPROPion (WELLBUTRIN XL) 150 MG 24 hr tablet Take 150 mg by mouth daily.  1   . diphenhydrAMINE (BENADRYL) 25 MG tablet Take 25 mg by mouth as needed (allergic reaction).     Marland Kitchen doxylamine, Sleep, (UNISOM) 25 MG tablet Take 25 mg by mouth at bedtime as needed for sleep.     . DUEXIS 800-26.6 MG TABS Take 1 tablet  by mouth 3 (three) times daily as needed (pain).   1   . EPINEPHrine (EPIPEN) 0.3 mg/0.3 mL DEVI Inject 0.3 mLs (0.3 mg total) into the muscle as needed (for severe allergic reaction). 3 Device 1 Unk  . escitalopram (LEXAPRO) 20 MG tablet Take 20 mg by mouth daily.  3   . hydrocortisone cream 1 % Apply 1 application topically daily as needed for itching.     . loratadine (CLARITIN) 10 MG tablet Take 10 mg by mouth daily as needed for allergies.   10/21/2012 at Unknown  . Magnesium 400 MG TABS Take 400 mg by mouth daily.     . Multiple Vitamin (MULTIVITAMIN WITH MINERALS) TABS tablet Take 1 tablet by mouth daily.     . Probiotic CAPS Take 1 capsule by mouth daily. Bio X 4     . simethicone (MYLICON) 125 MG chewable tablet Chew 125  mg by mouth as needed for flatulence.    10/21/2012 at Unknown   Allergies  Allergen Reactions  . Other Anaphylaxis    Mushrooms   . Latex Itching    Turns skin red  . Promethazine Hcl Other (See Comments)    Flailing of arms and legs, "out of mind" experience    Social History   Tobacco Use  . Smoking status: Former Smoker    Types: Cigarettes    Last attempt to quit: 05/18/2017    Years since quitting: 0.0  . Smokeless tobacco: Never Used  Substance Use Topics  . Alcohol use: Yes       Review of Systems  Constitutional: Negative.   HENT: Negative.   Eyes: Negative.   Respiratory: Negative.   Cardiovascular: Negative.   Gastrointestinal: Negative.   Genitourinary: Negative.   Musculoskeletal: Positive for back pain and joint pain.  Skin: Negative.   Neurological: Negative.   Endo/Heme/Allergies: Negative.   Psychiatric/Behavioral: The patient is nervous/anxious.     Objective:  Physical Exam  Constitutional: She is oriented to person, place, and time. She appears well-developed.  HENT:  Head: Normocephalic.  Eyes: Pupils are equal, round, and reactive to light.  Neck: Neck supple. No JVD present. No tracheal deviation present. No thyromegaly present.  Cardiovascular: Normal rate, regular rhythm and intact distal pulses.  Respiratory: Effort normal and breath sounds normal. No respiratory distress. She has no wheezes.  GI: Soft. There is no tenderness. There is no guarding.  Musculoskeletal:       Left knee: She exhibits decreased range of motion, swelling and bony tenderness. She exhibits no ecchymosis, no deformity, no laceration and no erythema. Tenderness found.  Lymphadenopathy:    She has no cervical adenopathy.  Neurological: She is alert and oriented to person, place, and time.  Skin: Skin is warm and dry.  Psychiatric: She has a normal mood and affect.      Labs:  Estimated body mass index is 27.24 kg/m as calculated from the following:    Height as of 05/24/17: 5' 4.5" (1.638 m).   Weight as of 05/24/17: 73.1 kg (161 lb 3.2 oz).   Imaging Review Plain radiographs demonstrate severe degenerative joint disease of the left knee(s).  The bone quality appears to be good for age and reported activity level.   Preoperative templating of the joint replacement has been completed, documented, and submitted to the Operating Room personnel in order to optimize intra-operative equipment management.    Patient's anticipated LOS is less than 2 midnights, meeting these requirements: - Younger than 65 -  Lives within 1 hour of care - Has a competent adult at home to recover with post-op recover - NO history of  - Chronic pain requiring opiods  - Diabetes  - Coronary Artery Disease  - Heart failure  - Heart attack  - Stroke  - DVT/VTE  - Cardiac arrhythmia  - Respiratory Failure/COPD  - Renal failure  - Anemia  - Advanced Liver disease        Assessment/Plan:  End stage arthritis, left knee   The patient history, physical examination, clinical judgment of the provider and imaging studies are consistent with end stage degenerative joint disease of the left knee(s) and total knee arthroplasty is deemed medically necessary. The treatment options including medical management, injection therapy arthroscopy and arthroplasty were discussed at length. The risks and benefits of total knee arthroplasty were presented and reviewed. The risks due to aseptic loosening, infection, stiffness, patella tracking problems, thromboembolic complications and other imponderables were discussed. The patient acknowledged the explanation, agreed to proceed with the plan and consent was signed. Patient is being admitted for inpatient treatment for surgery, pain control, PT, OT, prophylactic antibiotics, VTE prophylaxis, progressive ambulation and ADL's and discharge planning. The patient is planning to be discharged home.     Anastasio Auerbach Tersea Aulds    PA-C  05/26/2017, 9:51 AM

## 2017-06-02 LAB — TYPE AND SCREEN
ABO/RH(D): O POS
Antibody Screen: NEGATIVE

## 2017-06-05 MED ORDER — TRANEXAMIC ACID 1000 MG/10ML IV SOLN
1000.0000 mg | INTRAVENOUS | Status: AC
  Administered 2017-06-06: 1000 mg via INTRAVENOUS
  Filled 2017-06-05: qty 1100

## 2017-06-06 ENCOUNTER — Other Ambulatory Visit: Payer: Self-pay

## 2017-06-06 ENCOUNTER — Observation Stay (HOSPITAL_COMMUNITY)
Admission: RE | Admit: 2017-06-06 | Discharge: 2017-06-07 | Disposition: A | Discharge status: Home / Self Care | Payer: Worker's Compensation | Source: Ambulatory Visit | Attending: Orthopedic Surgery | Admitting: Orthopedic Surgery

## 2017-06-06 ENCOUNTER — Encounter (HOSPITAL_COMMUNITY)
Admission: RE | Disposition: A | Discharge status: Home / Self Care | Payer: Self-pay | Source: Ambulatory Visit | Attending: Orthopedic Surgery

## 2017-06-06 ENCOUNTER — Inpatient Hospital Stay (HOSPITAL_COMMUNITY): Payer: Worker's Compensation | Admitting: Certified Registered Nurse Anesthetist

## 2017-06-06 ENCOUNTER — Encounter (HOSPITAL_COMMUNITY): Payer: Self-pay | Admitting: *Deleted

## 2017-06-06 DIAGNOSIS — M1712 Unilateral primary osteoarthritis, left knee: Principal | ICD-10-CM | POA: Insufficient documentation

## 2017-06-06 DIAGNOSIS — F419 Anxiety disorder, unspecified: Secondary | ICD-10-CM | POA: Insufficient documentation

## 2017-06-06 DIAGNOSIS — Z96659 Presence of unspecified artificial knee joint: Secondary | ICD-10-CM

## 2017-06-06 DIAGNOSIS — Z79899 Other long term (current) drug therapy: Secondary | ICD-10-CM | POA: Insufficient documentation

## 2017-06-06 DIAGNOSIS — Z87891 Personal history of nicotine dependence: Secondary | ICD-10-CM | POA: Diagnosis not present

## 2017-06-06 DIAGNOSIS — F329 Major depressive disorder, single episode, unspecified: Secondary | ICD-10-CM | POA: Insufficient documentation

## 2017-06-06 HISTORY — PX: TOTAL KNEE ARTHROPLASTY: SHX125

## 2017-06-06 LAB — TYPE AND SCREEN
ABO/RH(D): O POS
Antibody Screen: NEGATIVE

## 2017-06-06 SURGERY — ARTHROPLASTY, KNEE, TOTAL
Anesthesia: Monitor Anesthesia Care | Site: Knee | Laterality: Left

## 2017-06-06 MED ORDER — PHENOL 1.4 % MT LIQD: 1.0000 | OROMUCOSAL | Status: DC | PRN

## 2017-06-06 MED ORDER — MIDAZOLAM HCL 2 MG/2ML IJ SOLN
1.0000 mg | INTRAMUSCULAR | Status: DC
  Administered 2017-06-06 (×2): 1 mg via INTRAVENOUS
  Filled 2017-06-06: qty 2

## 2017-06-06 MED ORDER — PROPOFOL 10 MG/ML IV BOLUS
INTRAVENOUS | Status: AC
  Filled 2017-06-06: qty 20

## 2017-06-06 MED ORDER — POLYETHYLENE GLYCOL 3350 17 G PO PACK: 17.0000 g | PACK | Freq: Two times a day (BID) | ORAL | 0 refills | Status: DC

## 2017-06-06 MED ORDER — FENTANYL CITRATE (PF) 100 MCG/2ML IJ SOLN
50.0000 ug | INTRAMUSCULAR | Status: DC
Start: 2017-06-06 — End: 2017-06-06
  Filled 2017-06-06: qty 2

## 2017-06-06 MED ORDER — ONDANSETRON HCL 4 MG PO TABS: 4.0000 mg | ORAL_TABLET | Freq: Four times a day (QID) | ORAL | Status: DC | PRN

## 2017-06-06 MED ORDER — METOCLOPRAMIDE HCL 5 MG PO TABS: 5.0000 mg | ORAL_TABLET | Freq: Three times a day (TID) | ORAL | Status: DC | PRN

## 2017-06-06 MED ORDER — BUPROPION HCL ER (XL) 150 MG PO TB24
150.0000 mg | ORAL_TABLET | Freq: Every day | ORAL | Status: DC
  Administered 2017-06-07: 150 mg via ORAL
  Filled 2017-06-06: qty 1

## 2017-06-06 MED ORDER — KETOROLAC TROMETHAMINE 30 MG/ML IJ SOLN
INTRAMUSCULAR | Status: AC
  Filled 2017-06-06: qty 1

## 2017-06-06 MED ORDER — TRANEXAMIC ACID 1000 MG/10ML IV SOLN
1000.0000 mg | Freq: Once | INTRAVENOUS | Status: AC
  Administered 2017-06-06: 1000 mg via INTRAVENOUS
  Filled 2017-06-06: qty 10

## 2017-06-06 MED ORDER — ACETAMINOPHEN 325 MG PO TABS: 325.0000 mg | ORAL_TABLET | Freq: Four times a day (QID) | ORAL | Status: DC | PRN

## 2017-06-06 MED ORDER — PROPOFOL 10 MG/ML IV BOLUS
INTRAVENOUS | Status: AC
  Filled 2017-06-06: qty 40

## 2017-06-06 MED ORDER — METHOCARBAMOL 1000 MG/10ML IJ SOLN
500.0000 mg | Freq: Four times a day (QID) | INTRAMUSCULAR | Status: DC | PRN
  Filled 2017-06-06: qty 5

## 2017-06-06 MED ORDER — MENTHOL 3 MG MT LOZG: 1.0000 | LOZENGE | OROMUCOSAL | Status: DC | PRN

## 2017-06-06 MED ORDER — OXYCODONE HCL 5 MG/5ML PO SOLN: 5.0000 mg | Freq: Once | ORAL | Status: DC | PRN

## 2017-06-06 MED ORDER — 0.9 % SODIUM CHLORIDE (POUR BTL) OPTIME
TOPICAL | Status: DC | PRN
  Administered 2017-06-06: 1000 mL

## 2017-06-06 MED ORDER — CEFAZOLIN SODIUM-DEXTROSE 2-4 GM/100ML-% IV SOLN
2.0000 g | Freq: Four times a day (QID) | INTRAVENOUS | Status: AC
  Administered 2017-06-06 – 2017-06-07 (×2): 2 g via INTRAVENOUS
  Filled 2017-06-06 (×2): qty 100

## 2017-06-06 MED ORDER — PROPOFOL 10 MG/ML IV BOLUS
INTRAVENOUS | Status: DC | PRN
  Administered 2017-06-06: 20 mg via INTRAVENOUS
  Administered 2017-06-06: 30 mg via INTRAVENOUS
  Administered 2017-06-06 (×4): 20 mg via INTRAVENOUS
  Administered 2017-06-06 (×2): 10 mg via INTRAVENOUS
  Administered 2017-06-06: 30 mg via INTRAVENOUS

## 2017-06-06 MED ORDER — KETOROLAC TROMETHAMINE 30 MG/ML IJ SOLN
INTRAMUSCULAR | Status: DC | PRN
  Administered 2017-06-06: 30 mg

## 2017-06-06 MED ORDER — DEXAMETHASONE SODIUM PHOSPHATE 10 MG/ML IJ SOLN
10.0000 mg | Freq: Once | INTRAMUSCULAR | Status: AC
  Administered 2017-06-06: 10 mg via INTRAVENOUS

## 2017-06-06 MED ORDER — SODIUM CHLORIDE 0.9 % IV SOLN
INTRAVENOUS | Status: DC
  Administered 2017-06-06: 22:00:00 via INTRAVENOUS

## 2017-06-06 MED ORDER — DOXYLAMINE SUCCINATE (SLEEP) 25 MG PO TABS
25.0000 mg | ORAL_TABLET | Freq: Every evening | ORAL | Status: DC | PRN
  Filled 2017-06-06: qty 1

## 2017-06-06 MED ORDER — LORATADINE 10 MG PO TABS: 10.0000 mg | ORAL_TABLET | Freq: Every day | ORAL | Status: DC | PRN

## 2017-06-06 MED ORDER — FERROUS SULFATE 325 (65 FE) MG PO TABS: 325.0000 mg | ORAL_TABLET | Freq: Three times a day (TID) | ORAL | 3 refills | Status: DC

## 2017-06-06 MED ORDER — HYDROCODONE-ACETAMINOPHEN 7.5-325 MG PO TABS
1.0000 | ORAL_TABLET | ORAL | Status: DC | PRN
  Administered 2017-06-07 (×4): 2 via ORAL
  Filled 2017-06-06 (×4): qty 2

## 2017-06-06 MED ORDER — FENTANYL CITRATE (PF) 100 MCG/2ML IJ SOLN: 25.0000 ug | INTRAMUSCULAR | Status: DC | PRN

## 2017-06-06 MED ORDER — PHENYLEPHRINE HCL 10 MG/ML IJ SOLN
INTRAMUSCULAR | Status: DC | PRN
  Administered 2017-06-06: 50 ug/min via INTRAVENOUS

## 2017-06-06 MED ORDER — BISACODYL 10 MG RE SUPP: 10.0000 mg | Freq: Every day | RECTAL | Status: DC | PRN

## 2017-06-06 MED ORDER — BUPIVACAINE IN DEXTROSE 0.75-8.25 % IT SOLN
INTRATHECAL | Status: DC | PRN
  Administered 2017-06-06: 2 mL via INTRATHECAL

## 2017-06-06 MED ORDER — DOCUSATE SODIUM 100 MG PO CAPS: 100.0000 mg | ORAL_CAPSULE | Freq: Two times a day (BID) | ORAL | 0 refills | Status: DC

## 2017-06-06 MED ORDER — HYDROCODONE-ACETAMINOPHEN 7.5-325 MG PO TABS: 1.0000 | ORAL_TABLET | ORAL | 0 refills | Status: DC | PRN

## 2017-06-06 MED ORDER — ALUM & MAG HYDROXIDE-SIMETH 200-200-20 MG/5ML PO SUSP: 15.0000 mL | ORAL | Status: DC | PRN

## 2017-06-06 MED ORDER — PHENYLEPHRINE 40 MCG/ML (10ML) SYRINGE FOR IV PUSH (FOR BLOOD PRESSURE SUPPORT)
PREFILLED_SYRINGE | INTRAVENOUS | Status: AC
  Filled 2017-06-06: qty 10

## 2017-06-06 MED ORDER — METHOCARBAMOL 500 MG PO TABS: 500.0000 mg | ORAL_TABLET | Freq: Four times a day (QID) | ORAL | 0 refills | Status: DC | PRN

## 2017-06-06 MED ORDER — ESMOLOL HCL 100 MG/10ML IV SOLN
INTRAVENOUS | Status: AC
  Filled 2017-06-06: qty 10

## 2017-06-06 MED ORDER — OXYCODONE HCL 5 MG PO TABS: 5.0000 mg | ORAL_TABLET | Freq: Once | ORAL | Status: DC | PRN

## 2017-06-06 MED ORDER — ESCITALOPRAM OXALATE 20 MG PO TABS
20.0000 mg | ORAL_TABLET | Freq: Every day | ORAL | Status: DC
  Administered 2017-06-07: 20 mg via ORAL
  Filled 2017-06-06: qty 1

## 2017-06-06 MED ORDER — SCOPOLAMINE 1 MG/3DAYS TD PT72
MEDICATED_PATCH | TRANSDERMAL | Status: AC
  Filled 2017-06-06: qty 1

## 2017-06-06 MED ORDER — METOCLOPRAMIDE HCL 5 MG/ML IJ SOLN: 5.0000 mg | Freq: Three times a day (TID) | INTRAMUSCULAR | Status: DC | PRN

## 2017-06-06 MED ORDER — MIDAZOLAM HCL 5 MG/5ML IJ SOLN
INTRAMUSCULAR | Status: DC | PRN
  Administered 2017-06-06: 2 mg via INTRAVENOUS

## 2017-06-06 MED ORDER — EPINEPHRINE 0.3 MG/0.3ML IJ SOAJ: 0.3000 mg | INTRAMUSCULAR | Status: DC | PRN

## 2017-06-06 MED ORDER — BUPIVACAINE HCL (PF) 0.25 % IJ SOLN
INTRAMUSCULAR | Status: AC
  Filled 2017-06-06: qty 30

## 2017-06-06 MED ORDER — ASPIRIN 81 MG PO CHEW
81.0000 mg | CHEWABLE_TABLET | Freq: Two times a day (BID) | ORAL | Status: DC
  Administered 2017-06-06 – 2017-06-07 (×2): 81 mg via ORAL
  Filled 2017-06-06 (×2): qty 1

## 2017-06-06 MED ORDER — SODIUM CHLORIDE 0.9 % IJ SOLN
INTRAMUSCULAR | Status: AC
  Filled 2017-06-06: qty 50

## 2017-06-06 MED ORDER — ALPRAZOLAM 0.25 MG PO TABS
0.2500 mg | ORAL_TABLET | Freq: Two times a day (BID) | ORAL | Status: DC | PRN
Start: 2017-06-06 — End: 2017-06-07
  Administered 2017-06-06 – 2017-06-07 (×2): 0.25 mg via ORAL
  Filled 2017-06-06 (×2): qty 1

## 2017-06-06 MED ORDER — PHENYLEPHRINE 40 MCG/ML (10ML) SYRINGE FOR IV PUSH (FOR BLOOD PRESSURE SUPPORT)
PREFILLED_SYRINGE | INTRAVENOUS | Status: DC | PRN
  Administered 2017-06-06 (×5): 80 ug via INTRAVENOUS

## 2017-06-06 MED ORDER — PROPOFOL 500 MG/50ML IV EMUL
INTRAVENOUS | Status: DC | PRN
  Administered 2017-06-06: 100 ug/kg/min via INTRAVENOUS

## 2017-06-06 MED ORDER — FERROUS SULFATE 325 (65 FE) MG PO TABS
325.0000 mg | ORAL_TABLET | Freq: Three times a day (TID) | ORAL | Status: DC
  Administered 2017-06-07 (×2): 325 mg via ORAL
  Filled 2017-06-06 (×2): qty 1

## 2017-06-06 MED ORDER — LACTATED RINGERS IV SOLN
INTRAVENOUS | Status: DC
  Administered 2017-06-06 (×3): via INTRAVENOUS

## 2017-06-06 MED ORDER — SODIUM CHLORIDE 0.9 % IJ SOLN
INTRAMUSCULAR | Status: DC | PRN
  Administered 2017-06-06: 29 mL

## 2017-06-06 MED ORDER — CELECOXIB 200 MG PO CAPS
200.0000 mg | ORAL_CAPSULE | Freq: Two times a day (BID) | ORAL | Status: DC
  Administered 2017-06-06 – 2017-06-07 (×2): 200 mg via ORAL
  Filled 2017-06-06 (×2): qty 1

## 2017-06-06 MED ORDER — METHOCARBAMOL 500 MG PO TABS
500.0000 mg | ORAL_TABLET | Freq: Four times a day (QID) | ORAL | Status: DC | PRN
  Administered 2017-06-07 (×2): 500 mg via ORAL
  Filled 2017-06-06 (×2): qty 1

## 2017-06-06 MED ORDER — CHLORHEXIDINE GLUCONATE 4 % EX LIQD: 60.0000 mL | Freq: Once | CUTANEOUS | Status: DC

## 2017-06-06 MED ORDER — SCOPOLAMINE 1 MG/3DAYS TD PT72
MEDICATED_PATCH | TRANSDERMAL | Status: DC | PRN
  Administered 2017-06-06: 1 via TRANSDERMAL

## 2017-06-06 MED ORDER — HYDROCODONE-ACETAMINOPHEN 5-325 MG PO TABS
1.0000 | ORAL_TABLET | ORAL | Status: DC | PRN
  Administered 2017-06-06: 2 via ORAL
  Filled 2017-06-06: qty 2

## 2017-06-06 MED ORDER — CEFAZOLIN SODIUM-DEXTROSE 2-4 GM/100ML-% IV SOLN
2.0000 g | INTRAVENOUS | Status: AC
  Administered 2017-06-06: 2 g via INTRAVENOUS
  Filled 2017-06-06: qty 100

## 2017-06-06 MED ORDER — STERILE WATER FOR IRRIGATION IR SOLN
Status: DC | PRN
  Administered 2017-06-06: 2000 mL

## 2017-06-06 MED ORDER — DEXAMETHASONE SODIUM PHOSPHATE 10 MG/ML IJ SOLN
10.0000 mg | Freq: Once | INTRAMUSCULAR | Status: AC
  Administered 2017-06-07: 10 mg via INTRAVENOUS
  Filled 2017-06-06: qty 1

## 2017-06-06 MED ORDER — HYDROMORPHONE HCL 1 MG/ML IJ SOLN: 0.5000 mg | INTRAMUSCULAR | Status: DC | PRN

## 2017-06-06 MED ORDER — ONDANSETRON HCL 4 MG/2ML IJ SOLN: 4.0000 mg | Freq: Four times a day (QID) | INTRAMUSCULAR | Status: DC | PRN

## 2017-06-06 MED ORDER — MIDAZOLAM HCL 2 MG/2ML IJ SOLN
INTRAMUSCULAR | Status: AC
  Filled 2017-06-06: qty 2

## 2017-06-06 MED ORDER — ASPIRIN 81 MG PO CHEW: 81.0000 mg | CHEWABLE_TABLET | Freq: Two times a day (BID) | ORAL | 0 refills | Status: AC

## 2017-06-06 MED ORDER — DIPHENHYDRAMINE HCL 12.5 MG/5ML PO ELIX
12.5000 mg | ORAL_SOLUTION | ORAL | Status: DC | PRN
  Administered 2017-06-06 – 2017-06-07 (×3): 25 mg via ORAL
  Filled 2017-06-06 (×3): qty 10

## 2017-06-06 MED ORDER — BUPIVACAINE-EPINEPHRINE (PF) 0.25% -1:200000 IJ SOLN
INTRAMUSCULAR | Status: DC | PRN
  Administered 2017-06-06: 30 mL

## 2017-06-06 MED ORDER — LIDOCAINE HCL (CARDIAC) PF 100 MG/5ML IV SOSY
PREFILLED_SYRINGE | INTRAVENOUS | Status: DC | PRN
  Administered 2017-06-06 (×2): 50 mg via INTRAVENOUS

## 2017-06-06 MED ORDER — DOCUSATE SODIUM 100 MG PO CAPS
100.0000 mg | ORAL_CAPSULE | Freq: Two times a day (BID) | ORAL | Status: DC
  Administered 2017-06-06 – 2017-06-07 (×2): 100 mg via ORAL
  Filled 2017-06-06 (×2): qty 1

## 2017-06-06 MED ORDER — POLYETHYLENE GLYCOL 3350 17 G PO PACK
17.0000 g | PACK | Freq: Two times a day (BID) | ORAL | Status: DC
  Administered 2017-06-07: 17 g via ORAL
  Filled 2017-06-06: qty 1

## 2017-06-06 MED ORDER — ONDANSETRON HCL 4 MG/2ML IJ SOLN
INTRAMUSCULAR | Status: DC | PRN
  Administered 2017-06-06: 4 mg via INTRAVENOUS

## 2017-06-06 MED ORDER — PHENYLEPHRINE HCL 10 MG/ML IJ SOLN
INTRAMUSCULAR | Status: AC
  Filled 2017-06-06: qty 1

## 2017-06-06 MED ORDER — MAGNESIUM CITRATE PO SOLN: 1.0000 | Freq: Once | ORAL | Status: DC | PRN

## 2017-06-06 SURGICAL SUPPLY — 54 items
ADH SKN CLS APL DERMABOND .7 (GAUZE/BANDAGES/DRESSINGS) ×1
BAG DECANTER FOR FLEXI CONT (MISCELLANEOUS) IMPLANT
BAG SPEC THK2 15X12 ZIP CLS (MISCELLANEOUS) ×1
BAG ZIPLOCK 12X15 (MISCELLANEOUS) ×2 IMPLANT
BANDAGE ACE 6X5 VEL STRL LF (GAUZE/BANDAGES/DRESSINGS) ×3 IMPLANT
BLADE SAW SGTL 11.0X1.19X90.0M (BLADE) ×2 IMPLANT
BLADE SAW SGTL 13.0X1.19X90.0M (BLADE) ×3 IMPLANT
BOWL SMART MIX CTS (DISPOSABLE) ×3 IMPLANT
CAPT KNEE TOTAL 3 ATTUNE ×2 IMPLANT
CEMENT HV SMART SET (Cement) ×6 IMPLANT
COVER SURGICAL LIGHT HANDLE (MISCELLANEOUS) ×3 IMPLANT
CUFF TOURN SGL QUICK 34 (TOURNIQUET CUFF) ×3
CUFF TRNQT CYL 34X4X40X1 (TOURNIQUET CUFF) ×1 IMPLANT
DECANTER SPIKE VIAL GLASS SM (MISCELLANEOUS) ×3 IMPLANT
DERMABOND ADVANCED (GAUZE/BANDAGES/DRESSINGS) ×2
DERMABOND ADVANCED .7 DNX12 (GAUZE/BANDAGES/DRESSINGS) ×1 IMPLANT
DRAPE U-SHAPE 47X51 STRL (DRAPES) ×3 IMPLANT
DRESSING AQUACEL AG SP 3.5X10 (GAUZE/BANDAGES/DRESSINGS) ×1 IMPLANT
DRSG AQUACEL AG SP 3.5X10 (GAUZE/BANDAGES/DRESSINGS) ×3
DURAPREP 26ML APPLICATOR (WOUND CARE) ×6 IMPLANT
ELECT REM PT RETURN 15FT ADLT (MISCELLANEOUS) ×3 IMPLANT
GLOVE BIOGEL M 7.0 STRL (GLOVE) IMPLANT
GLOVE BIOGEL PI IND STRL 7.5 (GLOVE) ×1 IMPLANT
GLOVE BIOGEL PI IND STRL 8.5 (GLOVE) ×1 IMPLANT
GLOVE BIOGEL PI INDICATOR 7.5 (GLOVE) ×2
GLOVE BIOGEL PI INDICATOR 8.5 (GLOVE) ×2
GLOVE ECLIPSE 8.0 STRL XLNG CF (GLOVE) ×1 IMPLANT
GLOVE ORTHO TXT STRL SZ7.5 (GLOVE) ×2 IMPLANT
GLOVE SURG SS PI 7.5 STRL IVOR (GLOVE) ×4 IMPLANT
GLOVE SURG SS PI 8.5 STRL IVOR (GLOVE) ×2
GLOVE SURG SS PI 8.5 STRL STRW (GLOVE) IMPLANT
GOWN STRL REUS W/TWL 2XL LVL3 (GOWN DISPOSABLE) ×3 IMPLANT
GOWN STRL REUS W/TWL LRG LVL3 (GOWN DISPOSABLE) ×3 IMPLANT
HANDPIECE INTERPULSE COAX TIP (DISPOSABLE) ×3
HOLDER FOLEY CATH W/STRAP (MISCELLANEOUS) ×2 IMPLANT
MANIFOLD NEPTUNE II (INSTRUMENTS) ×3 IMPLANT
NDL SAFETY ECLIPSE 18X1.5 (NEEDLE) IMPLANT
NEEDLE HYPO 18GX1.5 SHARP (NEEDLE)
PACK TOTAL KNEE CUSTOM (KITS) ×3 IMPLANT
POSITIONER SURGICAL ARM (MISCELLANEOUS) ×3 IMPLANT
SET HNDPC FAN SPRY TIP SCT (DISPOSABLE) ×1 IMPLANT
SET PAD KNEE POSITIONER (MISCELLANEOUS) ×3 IMPLANT
SUT MNCRL AB 4-0 PS2 18 (SUTURE) ×3 IMPLANT
SUT STRATAFIX PDS+ 0 24IN (SUTURE) ×3 IMPLANT
SUT VIC AB 1 CT1 36 (SUTURE) ×3 IMPLANT
SUT VIC AB 2-0 CT1 27 (SUTURE) ×9
SUT VIC AB 2-0 CT1 TAPERPNT 27 (SUTURE) ×3 IMPLANT
SYR 3ML LL SCALE MARK (SYRINGE) IMPLANT
SYR 50ML LL SCALE MARK (SYRINGE) ×1 IMPLANT
TRAY FOLEY CATH SILVER 16FR LF (SET/KITS/TRAYS/PACK) ×2 IMPLANT
TRAY FOLEY MTR SLVR 16FR STAT (SET/KITS/TRAYS/PACK) ×1 IMPLANT
WATER STERILE IRR 1000ML POUR (IV SOLUTION) ×5 IMPLANT
WRAP KNEE MAXI GEL POST OP (GAUZE/BANDAGES/DRESSINGS) ×3 IMPLANT
YANKAUER SUCT BULB TIP 10FT TU (MISCELLANEOUS) ×3 IMPLANT

## 2017-06-06 NOTE — Op Note (Signed)
NAME:  Joy Tucker                      MEDICAL RECORD NO.:  [id number]                             FACILITY:  [facility]      PHYSICIAN:  Madlyn Frankel. Charlann Boxer, M.D.  DATE OF BIRTH:  [birth year]      DATE OF PROCEDURE:  06/06/2017                                     OPERATIVE REPORT         PREOPERATIVE DIAGNOSIS:  Left knee osteoarthritis.      POSTOPERATIVE DIAGNOSIS:  Left knee osteoarthritis.      FINDINGS:  The patient was noted to have complete loss of cartilage and   bone-on-bone arthritis with associated osteophytes in the medial and patellofermoral compartments of   the knee with a significant synovitis and associated effusion.      PROCEDURE:  Left total knee replacement.      COMPONENTS USED:  DePuy Attune rotating platform posterior stabilized knee   system, a size 4N femur, 4 tibia, size 5 mm PS AOX insert, and 32 anatomic patellar   button.      SURGEON:  Madlyn Frankel. Charlann Boxer, M.D.      ASSISTANT:  Lanney Gins, PA-C.      ANESTHESIA:  Regional and Spinal.      SPECIMENS:  None.      COMPLICATION:  None.      DRAINS:  None.  EBL: <200cc      TOURNIQUET TIME:   Total Tourniquet Time Documented: Thigh (Left) - 29 minutes Total: Thigh (Left) - 29 minutes  .      The patient was stable to the recovery room.      INDICATION FOR PROCEDURE:  Joy Tucker is a 52 y.o. female patient of   mine.  The patient had been seen, evaluated, and treated conservatively in the   office with medication, activity modification, and injections.  The patient had   radiographic changes of bone-on-bone arthritis with endplate sclerosis and osteophytes noted.      The patient failed conservative measures including medication, injections, and activity modification, and at this point was ready for more definitive measures.   Based on the radiographic changes and failed conservative measures, the patient   decided to proceed with total knee replacement.  Risks of infection,   DVT,  component failure, need for revision surgery, postop course, and   expectations were all   discussed and reviewed.  Consent was obtained for benefit of pain   relief.      PROCEDURE IN DETAIL:  The patient was brought to the operative theater.   Once adequate anesthesia, preoperative antibiotics, 2 gm of Ancef, 1 gm of Tranexamic Acid, and 10 mg of Decadron administered, the patient was positioned supine with the left thigh tourniquet placed.  The  left lower extremity was prepped and draped in sterile fashion.  A time-   out was performed identifying the patient, planned procedure, and   extremity.      The left lower extremity was placed in the [facility] leg holder.  The leg was   exsanguinated, tourniquet elevated to 250 mmHg.  A midline incision was  made followed by median parapatellar arthrotomy.  Following initial   exposure, attention was first directed to the patella.  Precut   measurement was noted to be 21 mm.  I resected down to 13 mm and used a   32 anatomic patellar button to restore patellar height as well as cover the cut   surface.      The lug holes were drilled and a metal shim was placed to protect the   patella from retractors and saw blades.      At this point, attention was now directed to the femur.  The femoral   canal was opened with a drill, irrigated to try to prevent fat emboli.  An   intramedullary rod was passed at 3 degrees valgus, 9 mm of bone was   resected off the distal femur.  Following this resection, the tibia was   subluxated anteriorly.  Using the extramedullary guide, 2 mm of bone was resected off   the proximal medial tibia.  We confirmed the gap would be   stable medially and laterally with a size 5 spacer block as well as confirmed   the cut was perpendicular in the coronal plane, checking with an alignment rod.      Once this was done, I sized the femur to be a size 4 in the anterior-   posterior dimension, chose a narrow component based on  medial and   lateral dimension.  The size 4 rotation block was then pinned in   position anterior referenced using the C-clamp to set rotation.  The   anterior, posterior, and  chamfer cuts were made without difficulty nor   notching making certain that I was along the anterior cortex to help   with flexion gap stability.      The final box cut was made off the lateral aspect of distal femur.      At this point, the tibia was sized to be a size 4, the size 4 tray was   then pinned in position through the medial third of the tubercle,   drilled, and keel punched.  Trial reduction was now carried with a 4 femur,  4 tibia, a size 5 mm insert, and the 32 anatomic patella botton.  The knee was brought to   extension, full extension with good flexion stability with the patella   tracking through the trochlea without application of pressure.  Given   all these findings the femoral lug holes were drilled and then the trial components removed.  Final components were   opened and cement was mixed.  The knee was irrigated with normal saline   solution and pulse lavage.  The synovial lining was   then injected with 30 cc of 0.25% Marcaine with epinephrine and 1 cc of Toradol and 30 cc of NS for a total of 61 cc.      The knee was irrigated.  Final implants were then cemented onto clean and   dried cut surfaces of bone with the knee brought to extension with a size 5 mm PS trial insert.      Once the cement had fully cured, the excess cement was removed   throughout the knee.  I confirmed I was satisfied with the range of   motion and stability, and the final size 5 mm PS AOX insert was chosen.  It was   placed into the knee.      The tourniquet had been let down at 29  minutes.  No significant   hemostasis required.  The   extensor mechanism was then reapproximated using #1 Vicryl and #1 Stratafix sutures with the knee   in flexion.  The   remaining wound was closed with 2-0 Vicryl and running 4-0  Monocryl.   The knee was cleaned, dried, dressed sterilely using Dermabond and   Aquacel dressing.  The patient was then   brought to recovery room in stable condition, tolerating the procedure   well.   Please note that Physician Assistant, Lanney Gins, PA-C, was present for the entirety of the case, and was utilized for pre-operative positioning, peri-operative retractor management, general facilitation of the procedure.  He was also utilized for primary wound closure at the end of the case.              Madlyn Frankel Charlann Boxer, M.D.    06/06/2017 3:32 PM

## 2017-06-06 NOTE — Anesthesia Preprocedure Evaluation (Signed)
Anesthesia Evaluation  Patient identified by MRN, date of birth, ID band Patient awake    Reviewed: Allergy & Precautions, NPO status , Patient's Chart, lab work & pertinent test results  History of Anesthesia Complications (+) PONV and history of anesthetic complications  Airway Mallampati: II  TM Distance: >3 FB Neck ROM: Full    Dental  (+) Teeth Intact   Pulmonary neg shortness of breath, neg COPD, Recent URI , former smoker,    breath sounds clear to auscultation       Cardiovascular negative cardio ROS   Rhythm:Regular     Neuro/Psych PSYCHIATRIC DISORDERS Anxiety Depression negative neurological ROS     GI/Hepatic Neg liver ROS, GERD  Controlled,  Endo/Other  negative endocrine ROS  Renal/GU negative Renal ROS     Musculoskeletal  (+) Arthritis ,   Abdominal   Peds  Hematology negative hematology ROS (+)   Anesthesia Other Findings   Reproductive/Obstetrics                             Anesthesia Physical Anesthesia Plan  ASA: II  Anesthesia Plan: MAC, Regional and Spinal   Post-op Pain Management:  Regional for Post-op pain   Induction:   PONV Risk Score and Plan: 3 and Propofol infusion and Scopolamine patch - Pre-op  Airway Management Planned: Nasal Cannula  Additional Equipment: None  Intra-op Plan:   Post-operative Plan:   Informed Consent: I have reviewed the patients History and Physical, chart, labs and discussed the procedure including the risks, benefits and alternatives for the proposed anesthesia with the patient or authorized representative who has indicated his/her understanding and acceptance.   Dental advisory given  Plan Discussed with: Surgeon and CRNA  Anesthesia Plan Comments:         Anesthesia Quick Evaluation

## 2017-06-06 NOTE — Discharge Instructions (Signed)

## 2017-06-06 NOTE — Anesthesia Procedure Notes (Addendum)
Anesthesia Regional Block: Adductor canal block   Pre-Anesthetic Checklist: ,, timeout performed, Correct Patient, Correct Site, Correct Laterality, Correct Procedure, Correct Position, site marked, Risks and benefits discussed,  Surgical consent,  Pre-op evaluation,  At surgeon's request and post-op pain management  Laterality: Lower and Right  Prep: chloraprep       Needles:  Injection technique: Single-shot  Needle Type: Echogenic Stimulator Needle          Additional Needles:   Procedures:,,,, ultrasound used (permanent image in chart),,,,  Narrative:  Start time: 06/06/2017 3:38 PM End time: 06/06/2017 3:41 PM Injection made incrementally with aspirations every 5 mL.  Performed by: Personally  Anesthesiologist: Val Eagle, MD  Additional Notes: H+P and labs reviewed, risks and benefits discussed with patient, procedure tolerated well without complications

## 2017-06-06 NOTE — Transfer of Care (Signed)
Immediate Anesthesia Transfer of Care Note  Patient: Joy Tucker  Procedure(s) Performed: LEFT TOTAL KNEE ARTHROPLASTY (Left Knee)  Patient Location: PACU  Anesthesia Type:Spinal  Level of Consciousness: awake, alert  and oriented  Airway & Oxygen Therapy: Patient Spontanous Breathing and Patient connected to nasal cannula oxygen  Post-op Assessment: Report given to RN and Post -op Vital signs reviewed and stable  Post vital signs: Reviewed and stable  Last Vitals:  Vitals Value Taken Time  BP 104/74 06/06/2017  3:50 PM  Temp    Pulse 88 06/06/2017  3:51 PM  Resp 18 06/06/2017  3:51 PM  SpO2 99 % 06/06/2017  3:51 PM  Vitals shown include unvalidated device data.  Last Pain:  Vitals:   06/06/17 1205  TempSrc: Oral  PainSc:          Complications: No apparent anesthesia complications

## 2017-06-06 NOTE — Interval H&P Note (Signed)
History and Physical Interval Note:  06/06/2017 1:43 PM  Joy Tucker  has presented today for surgery, with the diagnosis of Left knee osteoarthritis  The various methods of treatment have been discussed with the patient and family. After consideration of risks, benefits and other options for treatment, the patient has consented to  Procedure(s) with comments: LEFT TOTAL KNEE ARTHROPLASTY (Left) - 90 mins [name] to pull up on grid as a surgical intervention .  The patient's history has been reviewed, patient examined, no change in status, stable for surgery.  I have reviewed the patient's chart and labs.  Questions were answered to the patient's satisfaction.     [name]

## 2017-06-06 NOTE — Anesthesia Procedure Notes (Signed)
Procedure Name: MAC Date/Time: 06/06/2017 1:49 PM Performed by: Deliah Boston, CRNA Pre-anesthesia Checklist: Patient identified, Emergency Drugs available, Suction available, Patient being monitored and Timeout performed Patient Re-evaluated:Patient Re-evaluated prior to induction Oxygen Delivery Method: Nasal cannula Preoxygenation: Pre-oxygenation with 100% oxygen Placement Confirmation: positive ETCO2 and CO2 detector

## 2017-06-06 NOTE — Anesthesia Procedure Notes (Signed)
Spinal  Patient location during procedure: OR Start time: 06/06/2017 1:56 PM End time: 06/06/2017 1:59 PM Staffing Anesthesiologist: Val Eagle, MD Preanesthetic Checklist Completed: patient identified, surgical consent, pre-op evaluation, timeout performed, IV checked, risks and benefits discussed and monitors and equipment checked Spinal Block Patient position: sitting Prep: site prepped and draped and DuraPrep Patient monitoring: heart rate, cardiac monitor, continuous pulse ox and blood pressure Approach: midline Location: L3-4 Injection technique: single-shot Needle Needle type: Pencan  Needle gauge: 24 G Needle length: 10 cm Assessment Sensory level: T6

## 2017-06-07 ENCOUNTER — Encounter (HOSPITAL_COMMUNITY): Payer: Self-pay | Admitting: Orthopedic Surgery

## 2017-06-07 DIAGNOSIS — M1712 Unilateral primary osteoarthritis, left knee: Secondary | ICD-10-CM | POA: Diagnosis not present

## 2017-06-07 LAB — CBC
HCT: 30.5 % — ABNORMAL LOW (ref 36.0–46.0)
Hemoglobin: 10.6 g/dL — ABNORMAL LOW (ref 12.0–15.0)
MCH: 34.5 pg — AB (ref 26.0–34.0)
MCHC: 34.8 g/dL (ref 30.0–36.0)
MCV: 99.3 fL (ref 78.0–100.0)
PLATELETS: 161 10*3/uL (ref 150–400)
RBC: 3.07 MIL/uL — ABNORMAL LOW (ref 3.87–5.11)
RDW: 12.4 % (ref 11.5–15.5)
WBC: 7.2 10*3/uL (ref 4.0–10.5)

## 2017-06-07 LAB — BASIC METABOLIC PANEL
Anion gap: 11 (ref 5–15)
BUN: 12 mg/dL (ref 6–20)
CO2: 25 mmol/L (ref 22–32)
Calcium: 9 mg/dL (ref 8.9–10.3)
Chloride: 104 mmol/L (ref 101–111)
Creatinine, Ser: 0.64 mg/dL (ref 0.44–1.00)
Glucose, Bld: 150 mg/dL — ABNORMAL HIGH (ref 65–99)
POTASSIUM: 4.4 mmol/L (ref 3.5–5.1)
SODIUM: 140 mmol/L (ref 135–145)

## 2017-06-07 NOTE — Progress Notes (Signed)
Physical Therapy Treatment Patient Details Name: Joy Tucker MRN: [id number] DOB: October 11, 1965 Today's Date: 06/07/2017    History of Present Illness 52 yo female s/p L TKA 06/07/17. Hx of L meniscus repair    PT Comments    2nd session to practice stair negotiation. Pt was able to go up/down 2 steps with husband present to observe and assist. Noted continued intermittent buckling of L knee. Issued KI for pt to wear when OOB/ambulating. Practiced donning/doffing KI with pt and husband. Issued HEP for pt to perform 2x/day until she begins OP PT. Issued stair negotiation handout for pt/husband. All education completed. Pt stated she may not d/c home today because she is waiting on DME. All education completed. Okay to d/c from PT standpoint-made RN aware.     Follow Up Recommendations  Follow surgeon's recommendation for DC plan and follow-up therapies     Equipment Recommendations  3in1 (PT)    Recommendations for Other Services       Precautions / Restrictions Precautions Precautions: Fall;Knee Precaution Comments: Educated pt on refraining from placing pillow behind knee. Educated on proper use of KI Restrictions Weight Bearing Restrictions: No Other Position/Activity Restrictions: WBAT    Mobility  Bed Mobility Overal bed mobility: Needs Assistance Bed Mobility: Supine to Sit;Sit to Supine     Supine to sit: Min guard Sit to supine: Min guard;HOB elevated   General bed mobility comments: close guard for safety. cues for technique.   Transfers Overall transfer level: Needs assistance Equipment used: Rolling walker (2 wheeled) Transfers: Sit to/from Stand Sit to Stand: Min guard         General transfer comment: close guard for safety. VCs safety, hand placement.   Ambulation/Gait Ambulation/Gait assistance: Min assist Ambulation Distance (Feet): 50 Feet Assistive device: Rolling walker (2 wheeled) Gait Pattern/deviations: Step-to pattern     General Gait  Details: VCs safety, technique, sequence. Assist to stabilize throughout ambulation. Noted intermittent L knee buckling. Cued pt to peform step to pattern and to activate quads when taking weight on L leg. Pt c/o dyspnea and some lightheadedness. She did report being anxious and having recently been medicated.    Stairs Stairs: Yes Stairs assistance: Min assist Stair Management: Backwards;Step to pattern;With walker Number of Stairs: 2 General stair comments: Assist to stabilize pt and walker. Husband present to observe and stabilize walker. VCs safety, technique, sequence. Noted L knee to buckle intermittently.    Wheelchair Mobility    Modified Rankin (Stroke Patients Only)       Balance Overall balance assessment: Needs assistance;History of Falls         Standing balance support: Bilateral upper extremity supported Standing balance-Leahy Scale: Poor                              Cognition Arousal/Alertness: Awake/alert Behavior During Therapy: WFL for tasks assessed/performed Overall Cognitive Status: Within Functional Limits for tasks assessed                                        Exercises      General Comments        Pertinent Vitals/Pain Pain Assessment: 0-10 Pain Score: 5  Pain Location: L knee Pain Descriptors / Indicators: Aching;Sore;Discomfort Pain Intervention(s): Limited activity within patient's tolerance;Ice applied    Home Living  Prior Function            PT Goals (current goals can now be found in the care plan section) Progress towards PT goals: Progressing toward goals    Frequency    7X/week      PT Plan Current plan remains appropriate    Co-evaluation              AM-PAC PT "6 Clicks" Daily Activity  Outcome Measure  Difficulty turning over in bed (including adjusting bedclothes, sheets and blankets)?: A Little Difficulty moving from lying on back to  sitting on the side of the bed? : A Little Difficulty sitting down on and standing up from a chair with arms (e.g., wheelchair, bedside commode, etc,.)?: A Little Help needed moving to and from a bed to chair (including a wheelchair)?: A Little Help needed walking in hospital room?: A Little Help needed climbing 3-5 steps with a railing? : A Little 6 Click Score: 18    End of Session Equipment Utilized During Treatment: Gait belt Activity Tolerance: Patient tolerated treatment well Patient left: in bed;with call bell/phone within reach;with family/visitor present   PT Visit Diagnosis: Difficulty in walking, not elsewhere classified (R26.2);Pain;Muscle weakness (generalized) (M62.81);Other abnormalities of gait and mobility (R26.89) Pain - Right/Left: Left Pain - part of body: Knee     Time: 1610-9604 PT Time Calculation (min) (ACUTE ONLY): 21 min  Charges:  $Gait Training: 8-22 mins                    G Codes:         Rebeca Alert, MPT Pager: [phone]

## 2017-06-07 NOTE — Evaluation (Signed)
Physical Therapy Evaluation Patient Details Name: Joy Tucker MRN: [id number] DOB: 03/09/1965 Today's Date: 06/07/2017   History of Present Illness  52 yo female s/p L TKA 06/07/17. Hx of L meniscus repair  Clinical Impression  On eval, pt required Min assist for mobility. She walked ~50 feet with a RW. Moderate pain with activity. Mod encouragement for participation with PT. Explained importance of begin up and mobilizing as much as possible prior to d/c home. Will follow and progress activity as tolerated. Per chart, plan is for OP PT. Possible d/c home later today.     Follow Up Recommendations Follow surgeon's recommendation for DC plan and follow-up therapies    Equipment Recommendations  3in1 (PT)    Recommendations for Other Services       Precautions / Restrictions Precautions Precautions: Fall;Knee Restrictions Weight Bearing Restrictions: No Other Position/Activity Restrictions: WBAT      Mobility  Bed Mobility Overal bed mobility: Needs Assistance Bed Mobility: Supine to Sit;Sit to Supine     Supine to sit: Min guard;HOB elevated Sit to supine: Min guard;HOB elevated   General bed mobility comments: close guard for safety. cues for technique.   Transfers Overall transfer level: Needs assistance Equipment used: Rolling walker (2 wheeled) Transfers: Sit to/from Stand Sit to Stand: Min guard         General transfer comment: close guard for safety. VCs safety, hand placement.   Ambulation/Gait Ambulation/Gait assistance: Min assist Ambulation Distance (Feet): 50 Feet Assistive device: Rolling walker (2 wheeled) Gait Pattern/deviations: Step-to pattern;Antalgic     General Gait Details: VCs safety, technique, sequence. Assist to stabilize throughout ambulation. Noted intermittent L knee buckling. Cued pt to peform step to pattern and to activate quads when taking weigth on L leg.   Stairs            Wheelchair Mobility    Modified Rankin  (Stroke Patients Only)       Balance Overall balance assessment: Needs assistance;History of Falls         Standing balance support: Bilateral upper extremity supported Standing balance-Leahy Scale: Poor                               Pertinent Vitals/Pain Pain Assessment: 0-10 Pain Score: 7  Pain Location: L knee Pain Descriptors / Indicators: Aching;Sore;Discomfort Pain Intervention(s): Limited activity within patient's tolerance;Ice applied;Repositioned    Home Living Family/patient expects to be discharged to:: Private residence Living Arrangements: Spouse/significant other(husband works) Available Help at Discharge: Family Type of Home: House Home Access: Stairs to enter Entrance Stairs-Rails: None Secretary/administrator of Steps: 2 Home Layout: One level Home Equipment: Environmental consultant - 2 wheels      Prior Function Level of Independence: Independent               Hand Dominance        Extremity/Trunk Assessment   Upper Extremity Assessment Upper Extremity Assessment: Overall WFL for tasks assessed    Lower Extremity Assessment Lower Extremity Assessment: Generalized weakness(s/p L TKA)    Cervical / Trunk Assessment Cervical / Trunk Assessment: Normal  Communication   Communication: No difficulties  Cognition Arousal/Alertness: Awake/alert Behavior During Therapy: WFL for tasks assessed/performed Overall Cognitive Status: Within Functional Limits for tasks assessed  General Comments      Exercises Total Joint Exercises Ankle Circles/Pumps: AROM;Both;10 reps;Supine Quad Sets: AROM;Both;10 reps;Supine Heel Slides: AAROM;Left;10 reps;Supine Hip ABduction/ADduction: AAROM;Left;10 reps;Supine Straight Leg Raises: AAROM;Left;10 reps;Supine Goniometric ROM: ~10-60 degrees   Assessment/Plan    PT Assessment Patient needs continued PT services  PT Problem List Decreased  strength;Decreased range of motion;Decreased balance;Decreased mobility;Decreased knowledge of use of DME;Pain;Decreased activity tolerance       PT Treatment Interventions DME instruction;Gait training;Functional mobility training;Therapeutic activities;Balance training;Patient/family education;Therapeutic exercise;Stair training    PT Goals (Current goals can be found in the Care Plan section)  Acute Rehab PT Goals Patient Stated Goal: none stated PT Goal Formulation: With patient Time For Goal Achievement: 06/21/17 Potential to Achieve Goals: Good    Frequency 7X/week   Barriers to discharge        Co-evaluation               AM-PAC PT "6 Clicks" Daily Activity  Outcome Measure Difficulty turning over in bed (including adjusting bedclothes, sheets and blankets)?: A Little Difficulty moving from lying on back to sitting on the side of the bed? : A Little Difficulty sitting down on and standing up from a chair with arms (e.g., wheelchair, bedside commode, etc,.)?: A Little Help needed moving to and from a bed to chair (including a wheelchair)?: A Little Help needed walking in hospital room?: A Little Help needed climbing 3-5 steps with a railing? : A Lot 6 Click Score: 17    End of Session Equipment Utilized During Treatment: Gait belt Activity Tolerance: Patient tolerated treatment well Patient left: in bed;with call bell/phone within reach   PT Visit Diagnosis: Difficulty in walking, not elsewhere classified (R26.2);Pain;Muscle weakness (generalized) (M62.81);Other abnormalities of gait and mobility (R26.89) Pain - Right/Left: Left Pain - part of body: Knee    Time: 1610-9604 PT Time Calculation (min) (ACUTE ONLY): 30 min   Charges:   PT Evaluation $PT Eval Low Complexity: 1 Low PT Treatments $Gait Training: 8-22 mins   PT G Codes:         Rebeca Alert, MPT Pager: [phone]

## 2017-06-07 NOTE — Anesthesia Postprocedure Evaluation (Signed)
Anesthesia Post Note  Patient: Joy Tucker  Procedure(s) Performed: LEFT TOTAL KNEE ARTHROPLASTY (Left Knee)     Patient location during evaluation: PACU Anesthesia Type: Spinal, MAC and Regional Level of consciousness: awake and alert Pain management: pain level controlled Vital Signs Assessment: post-procedure vital signs reviewed and stable Respiratory status: spontaneous breathing, nonlabored ventilation, respiratory function stable and patient connected to nasal cannula oxygen Cardiovascular status: stable and blood pressure returned to baseline Postop Assessment: no apparent nausea or vomiting and spinal receding Anesthetic complications: no    Last Vitals:  Vitals:   06/07/17 0134 06/07/17 0558  BP: 119/79 (!) 147/90  Pulse: 76 68  Resp: 18 16  Temp: 36.7 C 36.4 C  SpO2: 100% 100%    Last Pain:  Vitals:   06/07/17 0735  TempSrc:   PainSc: 4                  Lawayne Hartig

## 2017-06-07 NOTE — Progress Notes (Signed)
Patient ID: Joy Tucker, female   DOB: March 03, 1965, 52 y.o.   MRN: [id number] Subjective: 1 Day Post-Op Procedure(s) (LRB): LEFT TOTAL KNEE ARTHROPLASTY (Left)    Patient reports pain as moderate.  Fairly anxious but she is aware of this variable.  Foley already out.  Reviewed procedure and post-op course  Objective:   VITALS:   Vitals:   06/07/17 0134 06/07/17 0558  BP: 119/79 (!) 147/90  Pulse: 76 68  Resp:    Temp: 98.1 F (36.7 C) 97.6 F (36.4 C)  SpO2: 100% 100%    Neurovascular intact Incision: dressing C/D/I  LABS Recent Labs    06/07/17 0520  HGB 10.6*  HCT 30.5*  WBC 7.2  PLT 161    Recent Labs    06/07/17 0520  NA 140  K 4.4  BUN 12  CREATININE 0.64  GLUCOSE 150*    No results for input(s): LABPT, INR in the last 72 hours.   Assessment/Plan: 1 Day Post-Op Procedure(s) (LRB): LEFT TOTAL KNEE ARTHROPLASTY (Left)   Advance diet Up with therapy  Plan for D/C to home today unless otherwise challenged Reviewed goals and expectations RTC in 2 weeks

## 2017-06-07 NOTE — Progress Notes (Signed)
Provided discharge instructions to patient and spouse.  Discussed home care and safety, pain management, constipation prevention, and wound care.  Both patient and spouse verbalized understanding.  All questions/concerns addressed; no further concerns at this time.  Patient discharging to home with all equipment and belongings.

## 2017-06-13 NOTE — Discharge Summary (Signed)
Physician Discharge Summary  Patient ID: NAREH MATZKE MRN: [id number] DOB/AGE: October 28, 1965 52 y.o.  Admit date: 06/06/2017 Discharge date: 06/07/2017   Procedures:  Procedure(s) (LRB): LEFT TOTAL KNEE ARTHROPLASTY (Left)  Attending Physician:  Dr. Durene Romans   Admission Diagnoses:   Left knee primary OA / pain  Discharge Diagnoses:  Active Problems:   S/P total knee replacement  Past Medical History:  Diagnosis Date  . Anxiety   . Arthritis   . Complication of anesthesia   . Depression   . Family history of adverse reaction to anesthesia    nausea and vomiting  . GERD (gastroesophageal reflux disease)   . IBS (irritable bowel syndrome)   . IBS (irritable bowel syndrome)   . PONV (postoperative nausea and vomiting)     HPI:    Joy Tucker, 52 y.o. female, has a history of pain and functional disability in the left knee due to arthritis and has failed non-surgical conservative treatments for greater than 12 weeks to include NSAID's and/or analgesics, corticosteriod injections, viscosupplementation injections, use of assistive devices and activity modification.  Onset of symptoms was gradual, starting ~1 year ago with gradually worsening course since that time. The patient noted prior procedures on the knee to include  arthroscopy and menisectomy on the left knee(s).  Patient currently rates pain in the left knee(s) at 10 out of 10 with activity. Patient has night pain, worsening of pain with activity and weight bearing, pain that interferes with activities of daily living, pain with passive range of motion, crepitus and joint swelling.  Patient has evidence of periarticular osteophytes and joint space narrowing by imaging studies. There is no active infection.  Risks, benefits and expectations were discussed with the patient.  Risks including but not limited to the risk of anesthesia, blood clots, nerve damage, blood vessel damage, failure of the prosthesis, infection and up  to and including death.  Patient understand the risks, benefits and expectations and wishes to proceed with surgery.   PCP: Patient, No Pcp Per   Discharged Condition: good  Hospital Course:  Patient underwent the above stated procedure on 06/06/2017. Patient tolerated the procedure well and brought to the recovery room in good condition and subsequently to the floor.  POD #1 BP: 147/90 ; Pulse: 68 ; Temp: 97.6 F (36.4 C) ; Resp: 16 Patient reports pain as moderate.  Fairly anxious but she is aware of this variable.  Foley already out.  Reviewed procedure and post-op course. Neurovascular intact and incision: dressing C/D/I.   LABS  Basename    HGB     10.6  HCT     30.5    Discharge Exam: General appearance: alert, cooperative and no distress Extremities: Homans sign is negative, no sign of DVT, no edema, redness or tenderness in the calves or thighs and no ulcers, gangrene or trophic changes  Disposition:  Home with follow up in 2 weeks   Follow-up Information    Durene Romans, MD. Schedule an appointment as soon as possible for a visit in 2 weeks.   Specialty:  Orthopedic Surgery Contact information: [address] [place] [address] [phone]           Discharge Instructions    Call MD / Call 911   Complete by:  As directed    If you experience chest pain or shortness of breath, CALL 911 and be transported to the hospital emergency room.  If you develope a fever above 101  F, pus (white drainage) or increased drainage or redness at the wound, or calf pain, call your surgeon's office.   Change dressing   Complete by:  As directed    Maintain surgical dressing until follow up in the clinic. If the edges start to pull up, may reinforce with tape. If the dressing is no longer working, may remove and cover with gauze and tape, but must keep the area dry and clean.  Call with any questions or concerns.   Constipation Prevention   Complete by:  As  directed    Drink plenty of fluids.  Prune juice may be helpful.  You may use a stool softener, such as Colace (over the counter) 100 mg twice a day.  Use MiraLax (over the counter) for constipation as needed.   Diet - low sodium heart healthy   Complete by:  As directed    Discharge instructions   Complete by:  As directed    Maintain surgical dressing until follow up in the clinic. If the edges start to pull up, may reinforce with tape. If the dressing is no longer working, may remove and cover with gauze and tape, but must keep the area dry and clean.  Follow up in 2 weeks at [facility]. Call with any questions or concerns.   Increase activity slowly as tolerated   Complete by:  As directed    Weight bearing as tolerated with assist device (walker, cane, etc) as directed, use it as long as suggested by your surgeon or therapist, typically at least 4-6 weeks.   TED hose   Complete by:  As directed    Use stockings (TED hose) for 2 weeks on both leg(s).  You may remove them at night for sleeping.      Allergies as of 06/07/2017      Reactions   Other Anaphylaxis   Mushrooms   Latex Itching   Turns skin red   Promethazine Hcl Other (See Comments)   Flailing of arms and legs, "out of mind" experience      Medication List    STOP taking these medications   DUEXIS 800-26.6 MG Tabs Generic drug:  Ibuprofen-Famotidine     TAKE these medications   ALPRAZolam 0.5 MG tablet Commonly known as:  XANAX Take 0.25 mg by mouth daily as needed for anxiety.   APPLE CIDER VINEGAR PO Take 1 capsule by mouth daily.   aspirin 81 MG chewable tablet Commonly known as:  ASPIRIN CHILDRENS Chew 1 tablet (81 mg total) by mouth 2 (two) times daily. Take for 4 weeks, then resume regular dose.   BEANO PO Take 1 tablet by mouth as needed (for gas).   buPROPion 150 MG 24 hr tablet Commonly known as:  WELLBUTRIN XL Take 150 mg by mouth daily.   diphenhydrAMINE 25 MG tablet Commonly  known as:  BENADRYL Take 25 mg by mouth as needed (allergic reaction).   docusate sodium 100 MG capsule Commonly known as:  COLACE Take 1 capsule (100 mg total) by mouth 2 (two) times daily.   doxylamine (Sleep) 25 MG tablet Commonly known as:  UNISOM Take 25 mg by mouth at bedtime as needed for sleep.   EPINEPHrine 0.3 mg/0.3 mL Devi Commonly known as:  EPIPEN Inject 0.3 mLs (0.3 mg total) into the muscle as needed (for severe allergic reaction).   escitalopram 20 MG tablet Commonly known as:  LEXAPRO Take 20 mg by mouth daily.   ferrous sulfate 325 (65 FE)  MG tablet Commonly known as:  FERROUSUL Take 1 tablet (325 mg total) by mouth 3 (three) times daily with meals.   HYDROcodone-acetaminophen 7.5-325 MG tablet Commonly known as:  NORCO Take 1-2 tablets by mouth every 4 (four) hours as needed for moderate pain.   hydrocortisone cream 1 % Apply 1 application topically daily as needed for itching.   loratadine 10 MG tablet Commonly known as:  CLARITIN Take 10 mg by mouth daily as needed for allergies.   Magnesium 400 MG Tabs Take 400 mg by mouth daily.   methocarbamol 500 MG tablet Commonly known as:  ROBAXIN Take 1 tablet (500 mg total) by mouth every 6 (six) hours as needed for muscle spasms.   multivitamin with minerals Tabs tablet Take 1 tablet by mouth daily.   polyethylene glycol packet Commonly known as:  MIRALAX / GLYCOLAX Take 17 g by mouth 2 (two) times daily.   Probiotic Caps Take 1 capsule by mouth daily. Bio X 4   simethicone 125 MG chewable tablet Commonly known as:  MYLICON Chew 125 mg by mouth as needed for flatulence.            Discharge Care Instructions  (From admission, onward)        Start     Ordered   06/07/17 0000  Change dressing    Comments:  Maintain surgical dressing until follow up in the clinic. If the edges start to pull up, may reinforce with tape. If the dressing is no longer working, may remove and cover with gauze  and tape, but must keep the area dry and clean.  Call with any questions or concerns.   06/07/17 1610       Signed: Anastasio Auerbach. Elanda Garmany   PA-C  06/13/2017, 2:49 PM

## 2018-02-06 DIAGNOSIS — F429 Obsessive-compulsive disorder, unspecified: Secondary | ICD-10-CM | POA: Insufficient documentation

## 2018-02-20 ENCOUNTER — Other Ambulatory Visit: Payer: Self-pay | Admitting: Obstetrics and Gynecology

## 2018-02-20 DIAGNOSIS — Z803 Family history of malignant neoplasm of breast: Secondary | ICD-10-CM

## 2018-03-03 ENCOUNTER — Ambulatory Visit
Admission: RE | Admit: 2018-03-03 | Discharge: 2018-03-03 | Disposition: A | Discharge status: Home / Self Care | Payer: No Typology Code available for payment source | Source: Ambulatory Visit | Attending: Obstetrics and Gynecology | Admitting: Obstetrics and Gynecology

## 2018-03-03 DIAGNOSIS — Z803 Family history of malignant neoplasm of breast: Secondary | ICD-10-CM

## 2018-03-03 MED ORDER — GADOBUTROL 1 MMOL/ML IV SOLN
7.0000 mL | Freq: Once | INTRAVENOUS | Status: AC | PRN
  Administered 2018-03-03: 7 mL via INTRAVENOUS

## 2018-12-08 ENCOUNTER — Other Ambulatory Visit: Payer: Self-pay

## 2018-12-08 DIAGNOSIS — Z20822 Contact with and (suspected) exposure to covid-19: Secondary | ICD-10-CM

## 2018-12-11 LAB — NOVEL CORONAVIRUS, NAA: SARS-CoV-2, NAA: NOT DETECTED

## 2020-09-05 DIAGNOSIS — M5416 Radiculopathy, lumbar region: Secondary | ICD-10-CM | POA: Insufficient documentation

## 2021-07-16 DIAGNOSIS — S330XXA Traumatic rupture of lumbar intervertebral disc, initial encounter: Secondary | ICD-10-CM | POA: Insufficient documentation

## 2021-09-02 DIAGNOSIS — M545 Low back pain, unspecified: Secondary | ICD-10-CM | POA: Insufficient documentation

## 2021-10-21 DIAGNOSIS — M5136 Other intervertebral disc degeneration, lumbar region: Secondary | ICD-10-CM | POA: Insufficient documentation

## 2021-10-21 DIAGNOSIS — M51369 Other intervertebral disc degeneration, lumbar region without mention of lumbar back pain or lower extremity pain: Secondary | ICD-10-CM | POA: Insufficient documentation

## 2021-12-11 ENCOUNTER — Institutional Professional Consult (permissible substitution): Payer: No Typology Code available for payment source | Admitting: Internal Medicine

## 2021-12-11 NOTE — Progress Notes (Incomplete)
   Joy Tucker, female    DOB: 1965-05-23   MRN: [id number]   Brief patient profile:  ***  *** referred to pulmonary clinic 12/11/2021 by *** for ***        History of Present Illness  12/11/2021  Pulmonary/ 1st office eval/Joy Tucker  No chief complaint on file.    Dyspnea:  *** Cough: *** Sleep: *** SABA use:   Past Medical History:  Diagnosis Date   Anxiety    Arthritis    Complication of anesthesia    Depression    Family history of adverse reaction to anesthesia    nausea and vomiting   GERD (gastroesophageal reflux disease)    IBS (irritable bowel syndrome)    IBS (irritable bowel syndrome)    PONV (postoperative nausea and vomiting)     Outpatient Medications Prior to Visit  Medication Sig Dispense Refill   Alpha-D-Galactosidase (BEANO PO) Take 1 tablet by mouth as needed (for gas).      ALPRAZolam (XANAX) 0.5 MG tablet Take 0.25 mg by mouth daily as needed for anxiety.      APPLE CIDER VINEGAR PO Take 1 capsule by mouth daily.     buPROPion (WELLBUTRIN XL) 150 MG 24 hr tablet Take 150 mg by mouth daily.  1   diphenhydrAMINE (BENADRYL) 25 MG tablet Take 25 mg by mouth as needed (allergic reaction).     docusate sodium (COLACE) 100 MG capsule Take 1 capsule (100 mg total) by mouth 2 (two) times daily. 10 capsule 0   doxylamine, Sleep, (UNISOM) 25 MG tablet Take 25 mg by mouth at bedtime as needed for sleep.     EPINEPHrine (EPIPEN) 0.3 mg/0.3 mL DEVI Inject 0.3 mLs (0.3 mg total) into the muscle as needed (for severe allergic reaction). 3 Device 1   escitalopram (LEXAPRO) 20 MG tablet Take 20 mg by mouth daily.  3   ferrous sulfate (FERROUSUL) 325 (65 FE) MG tablet Take 1 tablet (325 mg total) by mouth 3 (three) times daily with meals.  3   HYDROcodone-acetaminophen (NORCO) 7.5-325 MG tablet Take 1-2 tablets by mouth every 4 (four) hours as needed for moderate pain. 60 tablet 0   hydrocortisone cream 1 % Apply 1 application topically daily as needed for itching.      loratadine (CLARITIN) 10 MG tablet Take 10 mg by mouth daily as needed for allergies.     Magnesium 400 MG TABS Take 400 mg by mouth daily.     methocarbamol (ROBAXIN) 500 MG tablet Take 1 tablet (500 mg total) by mouth every 6 (six) hours as needed for muscle spasms. 40 tablet 0   Multiple Vitamin (MULTIVITAMIN WITH MINERALS) TABS tablet Take 1 tablet by mouth daily.     polyethylene glycol (MIRALAX / GLYCOLAX) packet Take 17 g by mouth 2 (two) times daily. 14 each 0   Probiotic CAPS Take 1 capsule by mouth daily. Bio X 4     simethicone (MYLICON) 125 MG chewable tablet Chew 125 mg by mouth as needed for flatulence.      No facility-administered medications prior to visit.     Objective:     LMP 12/07/2015 (Approximate)          Assessment   No problem-specific Assessment & Plan notes found for this encounter.     Sandrea Hughs, MD 12/11/2021

## 2022-04-27 ENCOUNTER — Ambulatory Visit (HOSPITAL_COMMUNITY): Payer: Self-pay | Admitting: Orthopedic Surgery

## 2022-05-24 ENCOUNTER — Other Ambulatory Visit: Payer: Self-pay

## 2022-05-24 DIAGNOSIS — I10 Essential (primary) hypertension: Secondary | ICD-10-CM | POA: Insufficient documentation

## 2022-05-31 ENCOUNTER — Other Ambulatory Visit: Payer: Self-pay

## 2022-06-01 ENCOUNTER — Encounter: Payer: Self-pay | Admitting: Vascular Surgery

## 2022-06-01 ENCOUNTER — Ambulatory Visit: Payer: Managed Care, Other (non HMO) | Admitting: Vascular Surgery

## 2022-06-01 VITALS — BP 120/80 | HR 93 | Temp 97.8°F | Resp 16 | Ht 64.0 in | Wt 152.0 lb

## 2022-06-01 DIAGNOSIS — M5416 Radiculopathy, lumbar region: Secondary | ICD-10-CM

## 2022-06-01 NOTE — Progress Notes (Signed)
Patient name: Joy Tucker MRN: [id number] DOB: 03/19/1965 Sex: female  REASON FOR CONSULT: Abdominal exposure for L5-S1 ALIF  HPI: Joy Tucker is a 57 y.o. female, with history of anxiety and chronic lower back pain that presents for preop evaluation of abdominal exposure for L5-S1 ALIF.  Patient has been under the care of Dr. Shon Baton.  She describes chronic lower back pain and has failed conservative management.  She had L5-S1 intradiscal injection which provided near complete relief of her pain.  Dr. Shon Baton has recommended an L5-S1 ALIF.  She has had 2 previous C-sections as her only abdominal surgery.  Past Medical History:  Diagnosis Date   Anxiety    Arthritis    Complication of anesthesia    Depression    Family history of adverse reaction to anesthesia    nausea and vomiting   GERD (gastroesophageal reflux disease)    IBS (irritable bowel syndrome)    IBS (irritable bowel syndrome)    PONV (postoperative nausea and vomiting)     Past Surgical History:  Procedure Laterality Date   BREAST SURGERY     x2 -left biopsy-benign   CESAREAN SECTION  7829,5621   DILATION AND CURETTAGE OF UTERUS     meniscus knee repair     TOTAL KNEE ARTHROPLASTY Left 06/06/2017   Procedure: LEFT TOTAL KNEE ARTHROPLASTY;  Surgeon: Durene Romans, MD;  Location: [facility];  Service: Orthopedics;  Laterality: Left;    No family history on file.  SOCIAL HISTORY: Social History   Socioeconomic History   Marital status: Married    Spouse name: Not on file   Number of children: Not on file   Years of education: Not on file   Highest education level: Not on file  Occupational History   Not on file  Tobacco Use   Smoking status: Former    Types: Cigarettes    Quit date: 05/18/2017    Years since quitting: 5.0   Smokeless tobacco: Never  Vaping Use   Vaping Use: Never used  Substance and Sexual Activity   Alcohol use: Yes   Drug use: No   Sexual activity: Not on file  Other Topics  Concern   Not on file  Social History Narrative   Not on file   Social Determinants of Health   Financial Resource Strain: Not on file  Food Insecurity: Not on file  Transportation Needs: Not on file  Physical Activity: Not on file  Stress: Not on file  Social Connections: Not on file  Intimate Partner Violence: Not on file    Allergies  Allergen Reactions   Mushroom Extract Complex Anaphylaxis   Other Anaphylaxis    Mushrooms    Promethazine Hcl Other (See Comments)   Latex Itching    Turns skin red       REVIEW OF SYSTEMS:  [X]  denotes positive finding, [ ]  denotes negative finding Cardiac  Comments:  Chest pain or chest pressure:    Shortness of breath upon exertion:    Short of breath when lying flat:    Irregular heart rhythm:        Vascular    Pain in calf, thigh, or hip brought on by ambulation:    Pain in feet at night that wakes you up from your sleep:     Blood clot in your veins:    Leg swelling:         Pulmonary    Oxygen at home:  Productive cough:     Wheezing:         Neurologic    Sudden weakness in arms or legs:     Sudden numbness in arms or legs:     Sudden onset of difficulty speaking or slurred speech:    Temporary loss of vision in one eye:     Problems with dizziness:         Gastrointestinal    Blood in stool:     Vomited blood:         Genitourinary    Burning when urinating:     Blood in urine:        Psychiatric    Major depression:         Hematologic    Bleeding problems:    Problems with blood clotting too easily:        Skin    Rashes or ulcers:        Constitutional    Fever or chills:      PHYSICAL EXAM: Vitals:   06/01/22 1542  BP: 120/80  Pulse: 93  Resp: 16  Temp: 97.8 F (36.6 C)  TempSrc: Temporal  SpO2: 99%  Weight: 152 lb (68.9 kg)  Height: 5\' 4"  (1.626 m)    GENERAL: The patient is a well-nourished female, in no acute distress. The vital signs are documented above. CARDIAC: There  is a regular rate and rhythm.  VASCULAR:  Bilateral femoral pulses palpable Bilateral DP PT pulses palpable PULMONARY: No respiratory distress. ABDOMEN: Soft and non-tender.Prior C-section scar MUSCULOSKELETAL: There are no major deformities or cyanosis. NEUROLOGIC: No focal weakness or paresthesias are detected. SKIN: There are no ulcers or rashes noted. PSYCHIATRIC: The patient has a normal affect.  DATA:   Most recent MRI reviewed    Assessment/Plan:  57 year old female with chronic lower back pain that presents for evaluation for anterior spine exposure for L5-S1 ALIF.  I reviewed her MRI and discussed she would be a good candidate for anterior approach.  I discussed transverse incision over the left rectus muscle and then mobilizing the rectus muscle to enter into the retroperitoneum and mobilize peritoneum and left ureter across midline.  Discussed mobilizing iliac artery and vein.  Discussed risk of injury to the above structures.  Discussed risk of hernia.  Look forward to assisting Dr. Shon Baton.  All questions answered.   Cephus Shelling, MD [facility] Office: [phone]

## 2022-06-03 NOTE — Pre-Procedure Instructions (Signed)
Surgical Instructions    Your procedure is scheduled on Monday, May 20th.  Report to [facility] Main Entrance "A" at 5:30 A.M., then check in with the Admitting office.  Call this number if you have problems the morning of surgery:  [phone]  If you have any questions prior to your surgery date call [phone]: Open Monday-Friday 8am-4pm If you experience any cold or flu symptoms such as cough, fever, chills, shortness of breath, etc. between now and your scheduled surgery, please notify us at the above number.     Remember:  Do not eat after midnight the night before your surgery  You may drink clear liquids until 4:30 a.m. the morning of your surgery.   Clear liquids allowed are: Water, Non-Citrus Juices (without pulp), Carbonated Beverages, Clear Tea, Black Coffee Only (NO MILK, CREAM OR POWDERED CREAMER of any kind), and Gatorade.    Take these medicines the morning of surgery with A SIP OF WATER  venlafaxine XR (EFFEXOR-XR)    Take these medications as needed: ALPRAZolam Prudy Feeler)  cetirizine (ZYRTEC)  famotidine (PEPCID)   As of today, STOP taking any Aspirin (unless otherwise instructed by your surgeon) Aleve, Naproxen, Ibuprofen, Motrin, Advil, Goody's, BC's, all herbal medications, fish oil, and all vitamins.                     Do NOT Smoke (Tobacco/Vaping) for 24 hours prior to your procedure.  If you use a CPAP at night, you may bring your mask/headgear for your overnight stay.   Contacts, glasses, piercing's, hearing aid's, dentures or partials may not be worn into surgery, please bring cases for these belongings.    For patients admitted to the hospital, discharge time will be determined by your treatment team.   Patients discharged the day of surgery will not be allowed to drive home, and someone needs to stay with them for 24 hours.  SURGICAL WAITING ROOM VISITATION Patients having surgery or a procedure may have no more than 2 support people in the waiting  area - these visitors may rotate.   Children under the age of 94 must have an adult with them who is not the patient. If the patient needs to stay at the hospital during part of their recovery, the visitor guidelines for inpatient rooms apply. Pre-op nurse will coordinate an appropriate time for 1 support person to accompany patient in pre-op.  This support person may not rotate.   Please refer to the [facility] website for the visitor guidelines for Inpatients (after your surgery is over and you are in a regular room).    Special instructions:   [facility]- Preparing For Surgery  Before surgery, you can play an important role. Because skin is not sterile, your skin needs to be as free of germs as possible. You can reduce the number of germs on your skin by washing with CHG (chlorahexidine gluconate) Soap before surgery.  CHG is an antiseptic cleaner which kills germs and bonds with the skin to continue killing germs even after washing.    Oral Hygiene is also important to reduce your risk of infection.  Remember - BRUSH YOUR TEETH THE MORNING OF SURGERY WITH YOUR REGULAR TOOTHPASTE   Day of Surgery: Take a shower with CHG soap. Do not wear jewelry or makeup Do not wear lotions, powders, perfumes, or deodorant. Do not shave 48 hours prior to surgery.  Do not bring valuables to the hospital.  [facility] is not responsible for any  belongings or valuables. Do not wear nail polish, gel polish, artificial nails, or any other type of covering on natural nails (fingers and toes) If you have artificial nails or gel coating that need to be removed by a nail salon, please have this removed prior to surgery. Artificial nails or gel coating may interfere with anesthesia's ability to adequately monitor your vital signs. Wear Clean/Comfortable clothing the morning of surgery Remember to brush your teeth WITH YOUR REGULAR TOOTHPASTE.   Please read over the following fact sheets that you were  given.    If you received a COVID test during your pre-op visit  it is requested that you wear a mask when out in public, stay away from anyone that may not be feeling well and notify your surgeon if you develop symptoms. If you have been in contact with anyone that has tested positive in the last 10 days please notify you surgeon.

## 2022-06-04 ENCOUNTER — Encounter (HOSPITAL_COMMUNITY): Payer: Self-pay

## 2022-06-04 ENCOUNTER — Other Ambulatory Visit: Payer: Self-pay

## 2022-06-04 ENCOUNTER — Encounter (HOSPITAL_COMMUNITY)
Admission: RE | Admit: 2022-06-04 | Discharge: 2022-06-04 | Disposition: A | Discharge status: Home / Self Care | Payer: Managed Care, Other (non HMO) | Source: Ambulatory Visit | Attending: Orthopedic Surgery

## 2022-06-04 VITALS — BP 126/88 | HR 91 | Temp 97.6°F | Resp 16 | Ht 64.0 in | Wt 151.3 lb

## 2022-06-04 DIAGNOSIS — I2510 Atherosclerotic heart disease of native coronary artery without angina pectoris: Secondary | ICD-10-CM | POA: Diagnosis not present

## 2022-06-04 DIAGNOSIS — Z01818 Encounter for other preprocedural examination: Secondary | ICD-10-CM | POA: Insufficient documentation

## 2022-06-04 HISTORY — DX: Essential (primary) hypertension: I10

## 2022-06-04 LAB — TYPE AND SCREEN
ABO/RH(D): O POS
Antibody Screen: NEGATIVE

## 2022-06-04 LAB — BASIC METABOLIC PANEL
Anion gap: 10 (ref 5–15)
BUN: 17 mg/dL (ref 6–20)
CO2: 27 mmol/L (ref 22–32)
Calcium: 9.6 mg/dL (ref 8.9–10.3)
Chloride: 97 mmol/L — ABNORMAL LOW (ref 98–111)
Creatinine, Ser: 0.65 mg/dL (ref 0.44–1.00)
GFR, Estimated: >60 mL/min (ref >60)
Glucose, Bld: 106 mg/dL — ABNORMAL HIGH (ref 70–99)
Potassium: 4.3 mmol/L (ref 3.5–5.1)
Sodium: 134 mmol/L — ABNORMAL LOW (ref 135–145)

## 2022-06-04 LAB — CBC
HCT: 41.3 % (ref 36.0–46.0)
Hemoglobin: 13.6 g/dL (ref 12.0–15.0)
MCH: 33.3 pg (ref 26.0–34.0)
MCHC: 32.9 g/dL (ref 30.0–36.0)
MCV: 101.2 fL — ABNORMAL HIGH (ref 80.0–100.0)
Platelets: 210 10*3/uL (ref 150–400)
RBC: 4.08 MIL/uL (ref 3.87–5.11)
RDW: 12.2 % (ref 11.5–15.5)
WBC: 5.1 10*3/uL (ref 4.0–10.5)
nRBC: 0 % (ref 0.0–0.2)

## 2022-06-04 LAB — SURGICAL PCR SCREEN
MRSA, PCR: NEGATIVE
Staphylococcus aureus: NEGATIVE

## 2022-06-04 NOTE — Progress Notes (Signed)
PCP - Dr. Salli Real Cardiologist - Denies  PPM/ICD - Denies  Chest x-ray - N/A EKG - 06/04/22 Stress Test - Denies ECHO - Denies Cardiac Cath - Denies  Sleep Study - Denies  Diabetes: Denies  Blood Thinner Instructions: N/A Aspirin Instructions: N/A  ERAS Protcol - Yes  COVID TEST- N/A   Anesthesia review: No  Patient denies shortness of breath, fever, cough and chest pain at PAT appointment   All instructions explained to the patient, with a verbal understanding of the material. Patient agrees to go over the instructions while at home for a better understanding. Patient also instructed to self quarantine after being tested for COVID-19. The opportunity to ask questions was provided.

## 2022-06-13 NOTE — Anesthesia Preprocedure Evaluation (Signed)
Anesthesia Evaluation  Patient identified by MRN, date of birth, ID band Patient awake    Reviewed: Allergy & Precautions, NPO status , Patient's Chart, lab work & pertinent test results  History of Anesthesia Complications (+) PONV and history of anesthetic complications  Airway Mallampati: II  TM Distance: >3 FB Neck ROM: Full    Dental  (+) Dental Advisory Given   Pulmonary neg shortness of breath, neg sleep apnea, neg COPD, neg recent URI, Patient abstained from smoking., former smoker   Pulmonary exam normal breath sounds clear to auscultation       Cardiovascular hypertension (lisinopril-HCTZ), Pt. on medications (-) angina (-) Past MI, (-) Cardiac Stents and (-) CABG (-) dysrhythmias  Rhythm:Regular Rate:Normal     Neuro/Psych neg Seizures PSYCHIATRIC DISORDERS (OCD) Anxiety Depression     Neuromuscular disease (lumbar radiculopathy)    GI/Hepatic Neg liver ROS,GERD  Medicated,,IBS   Endo/Other  negative endocrine ROS    Renal/GU negative Renal ROS     Musculoskeletal  (+) Arthritis , Osteoarthritis,    Abdominal   Peds  Hematology negative hematology ROS (+)   Anesthesia Other Findings   Reproductive/Obstetrics                             Anesthesia Physical Anesthesia Plan  ASA: 2  Anesthesia Plan: General   Post-op Pain Management: Regional block* and Tylenol PO (pre-op)*   Induction: Intravenous  PONV Risk Score and Plan: 4 or greater and Ondansetron, Dexamethasone, Midazolam, Scopolamine patch - Pre-op and Treatment may vary due to age or medical condition  Airway Management Planned: Oral ETT  Additional Equipment:   Intra-op Plan:   Post-operative Plan: Extubation in OR  Informed Consent: I have reviewed the patients History and Physical, chart, labs and discussed the procedure including the risks, benefits and alternatives for the proposed anesthesia with the  patient or authorized representative who has indicated his/her understanding and acceptance.     Dental advisory given  Plan Discussed with: Anesthesiologist and CRNA  Anesthesia Plan Comments: (Risks of general anesthesia discussed including, but not limited to, sore throat, hoarse voice, chipped/damaged teeth, injury to vocal cords, nausea and vomiting, allergic reactions, lung infection, heart attack, stroke, and death. All questions answered. )       Anesthesia Quick Evaluation

## 2022-06-14 ENCOUNTER — Ambulatory Visit (HOSPITAL_COMMUNITY): Payer: Managed Care, Other (non HMO)

## 2022-06-14 ENCOUNTER — Other Ambulatory Visit: Payer: Self-pay

## 2022-06-14 ENCOUNTER — Ambulatory Visit (HOSPITAL_COMMUNITY)
Admission: RE | Disposition: A | Discharge status: Home / Self Care | Payer: Self-pay | Source: Home / Self Care | Attending: Orthopedic Surgery

## 2022-06-14 ENCOUNTER — Ambulatory Visit (HOSPITAL_BASED_OUTPATIENT_CLINIC_OR_DEPARTMENT_OTHER): Payer: Managed Care, Other (non HMO) | Admitting: Anesthesiology

## 2022-06-14 ENCOUNTER — Encounter (HOSPITAL_COMMUNITY): Payer: Self-pay | Admitting: Orthopedic Surgery

## 2022-06-14 ENCOUNTER — Ambulatory Visit (HOSPITAL_COMMUNITY): Payer: Managed Care, Other (non HMO) | Admitting: Anesthesiology

## 2022-06-14 ENCOUNTER — Observation Stay (HOSPITAL_COMMUNITY)
Admission: RE | Admit: 2022-06-14 | Discharge: 2022-06-16 | Disposition: A | Discharge status: Home / Self Care | Payer: Managed Care, Other (non HMO) | Attending: Orthopedic Surgery | Admitting: Orthopedic Surgery

## 2022-06-14 DIAGNOSIS — I10 Essential (primary) hypertension: Secondary | ICD-10-CM

## 2022-06-14 DIAGNOSIS — Z91040 Latex allergy status: Secondary | ICD-10-CM | POA: Insufficient documentation

## 2022-06-14 DIAGNOSIS — G8929 Other chronic pain: Secondary | ICD-10-CM | POA: Insufficient documentation

## 2022-06-14 DIAGNOSIS — Z87891 Personal history of nicotine dependence: Secondary | ICD-10-CM | POA: Diagnosis not present

## 2022-06-14 DIAGNOSIS — M5117 Intervertebral disc disorders with radiculopathy, lumbosacral region: Secondary | ICD-10-CM

## 2022-06-14 DIAGNOSIS — G5790 Unspecified mononeuropathy of unspecified lower limb: Secondary | ICD-10-CM | POA: Insufficient documentation

## 2022-06-14 DIAGNOSIS — M5136 Other intervertebral disc degeneration, lumbar region: Secondary | ICD-10-CM | POA: Diagnosis not present

## 2022-06-14 DIAGNOSIS — Z96652 Presence of left artificial knee joint: Secondary | ICD-10-CM | POA: Insufficient documentation

## 2022-06-14 DIAGNOSIS — F418 Other specified anxiety disorders: Secondary | ICD-10-CM

## 2022-06-14 DIAGNOSIS — M545 Low back pain, unspecified: Principal | ICD-10-CM | POA: Insufficient documentation

## 2022-06-14 DIAGNOSIS — Z981 Arthrodesis status: Principal | ICD-10-CM

## 2022-06-14 HISTORY — PX: ABDOMINAL EXPOSURE: SHX5708

## 2022-06-14 HISTORY — PX: ANTERIOR LUMBAR FUSION: SHX1170

## 2022-06-14 LAB — CBC
HCT: 37.2 % (ref 36.0–46.0)
Hemoglobin: 12.5 g/dL (ref 12.0–15.0)
MCH: 33.7 pg (ref 26.0–34.0)
MCHC: 33.6 g/dL (ref 30.0–36.0)
MCV: 100.3 fL — ABNORMAL HIGH (ref 80.0–100.0)
Platelets: 218 10*3/uL (ref 150–400)
RBC: 3.71 MIL/uL — ABNORMAL LOW (ref 3.87–5.11)
RDW: 12.3 % (ref 11.5–15.5)
WBC: 8.7 10*3/uL (ref 4.0–10.5)
nRBC: 0 % (ref 0.0–0.2)

## 2022-06-14 LAB — CREATININE, SERUM
Creatinine, Ser: 0.76 mg/dL (ref 0.44–1.00)
GFR, Estimated: >60 mL/min (ref >60)

## 2022-06-14 LAB — TYPE AND SCREEN
ABO/RH(D): O POS
Antibody Screen: NEGATIVE

## 2022-06-14 SURGERY — ANTERIOR LUMBAR FUSION 1 LEVEL
Anesthesia: General | Site: Spine Lumbar

## 2022-06-14 MED ORDER — ONDANSETRON HCL 4 MG/2ML IJ SOLN
4.0000 mg | Freq: Four times a day (QID) | INTRAMUSCULAR | Status: DC | PRN
  Filled 2022-06-14: qty 2

## 2022-06-14 MED ORDER — SENNOSIDES-DOCUSATE SODIUM 8.6-50 MG PO TABS
1.0000 | ORAL_TABLET | Freq: Two times a day (BID) | ORAL | Status: DC
  Administered 2022-06-14 – 2022-06-16 (×4): 1 via ORAL
  Filled 2022-06-14 (×4): qty 1

## 2022-06-14 MED ORDER — OXYCODONE HCL 5 MG PO TABS
10.0000 mg | ORAL_TABLET | ORAL | Status: DC | PRN
  Administered 2022-06-14 – 2022-06-16 (×14): 10 mg via ORAL
  Filled 2022-06-14 (×14): qty 2

## 2022-06-14 MED ORDER — FENTANYL CITRATE (PF) 250 MCG/5ML IJ SOLN
INTRAMUSCULAR | Status: DC | PRN
  Administered 2022-06-14 (×5): 50 ug via INTRAVENOUS

## 2022-06-14 MED ORDER — CHLORHEXIDINE GLUCONATE CLOTH 2 % EX PADS: 6.0000 | MEDICATED_PAD | Freq: Once | CUTANEOUS | Status: DC

## 2022-06-14 MED ORDER — LISINOPRIL 10 MG PO TABS
10.0000 mg | ORAL_TABLET | Freq: Every day | ORAL | Status: DC
  Administered 2022-06-15 – 2022-06-16 (×2): 10 mg via ORAL
  Filled 2022-06-14 (×2): qty 1

## 2022-06-14 MED ORDER — SODIUM CHLORIDE 0.9% FLUSH
3.0000 mL | Freq: Two times a day (BID) | INTRAVENOUS | Status: DC
  Administered 2022-06-14: 3 mL via INTRAVENOUS

## 2022-06-14 MED ORDER — FENTANYL CITRATE (PF) 250 MCG/5ML IJ SOLN
INTRAMUSCULAR | Status: AC
  Filled 2022-06-14: qty 5

## 2022-06-14 MED ORDER — ACETAMINOPHEN 10 MG/ML IV SOLN: 1000.0000 mg | Freq: Once | INTRAVENOUS | Status: DC | PRN

## 2022-06-14 MED ORDER — MENTHOL 3 MG MT LOZG: 1.0000 | LOZENGE | OROMUCOSAL | Status: DC | PRN

## 2022-06-14 MED ORDER — LIDOCAINE 2% (20 MG/ML) 5 ML SYRINGE
INTRAMUSCULAR | Status: AC
  Filled 2022-06-14: qty 5

## 2022-06-14 MED ORDER — LACTATED RINGERS IV SOLN: INTRAVENOUS | Status: DC

## 2022-06-14 MED ORDER — METHOCARBAMOL 500 MG PO TABS: 500.0000 mg | ORAL_TABLET | Freq: Three times a day (TID) | ORAL | 0 refills | Status: AC | PRN

## 2022-06-14 MED ORDER — CEFAZOLIN SODIUM-DEXTROSE 1-4 GM/50ML-% IV SOLN
1.0000 g | Freq: Three times a day (TID) | INTRAVENOUS | Status: AC
  Administered 2022-06-14 (×2): 1 g via INTRAVENOUS
  Filled 2022-06-14 (×2): qty 50

## 2022-06-14 MED ORDER — VENLAFAXINE HCL ER 37.5 MG PO CP24
37.5000 mg | ORAL_CAPSULE | Freq: Every day | ORAL | Status: DC
  Administered 2022-06-15 – 2022-06-16 (×2): 37.5 mg via ORAL
  Filled 2022-06-14 (×2): qty 1

## 2022-06-14 MED ORDER — PROPOFOL 10 MG/ML IV BOLUS
INTRAVENOUS | Status: AC
  Filled 2022-06-14: qty 20

## 2022-06-14 MED ORDER — THROMBIN 20000 UNITS EX KIT
PACK | CUTANEOUS | Status: DC | PRN
  Administered 2022-06-14: 20 mL via TOPICAL

## 2022-06-14 MED ORDER — ACETAMINOPHEN 650 MG RE SUPP: 650.0000 mg | RECTAL | Status: DC | PRN

## 2022-06-14 MED ORDER — ROCURONIUM BROMIDE 10 MG/ML (PF) SYRINGE
PREFILLED_SYRINGE | INTRAVENOUS | Status: AC
  Filled 2022-06-14: qty 20

## 2022-06-14 MED ORDER — ONDANSETRON HCL 4 MG PO TABS: 4.0000 mg | ORAL_TABLET | Freq: Three times a day (TID) | ORAL | 0 refills | Status: AC | PRN

## 2022-06-14 MED ORDER — METHOCARBAMOL 500 MG PO TABS
500.0000 mg | ORAL_TABLET | Freq: Four times a day (QID) | ORAL | Status: DC | PRN
  Administered 2022-06-14 – 2022-06-16 (×8): 500 mg via ORAL
  Filled 2022-06-14 (×8): qty 1

## 2022-06-14 MED ORDER — HYDROMORPHONE HCL 1 MG/ML IJ SOLN
INTRAMUSCULAR | Status: AC
  Filled 2022-06-14: qty 1

## 2022-06-14 MED ORDER — OXYCODONE HCL 5 MG PO TABS: 5.0000 mg | ORAL_TABLET | ORAL | Status: DC | PRN

## 2022-06-14 MED ORDER — HYDROMORPHONE HCL 1 MG/ML IJ SOLN: 1.0000 mg | INTRAMUSCULAR | Status: AC | PRN

## 2022-06-14 MED ORDER — OXYCODONE HCL 5 MG PO TABS: 5.0000 mg | ORAL_TABLET | Freq: Once | ORAL | Status: DC | PRN

## 2022-06-14 MED ORDER — MIDAZOLAM HCL 2 MG/2ML IJ SOLN
INTRAMUSCULAR | Status: DC | PRN
  Administered 2022-06-14: 2 mg via INTRAVENOUS

## 2022-06-14 MED ORDER — OXYCODONE HCL 5 MG/5ML PO SOLN: 5.0000 mg | Freq: Once | ORAL | Status: DC | PRN

## 2022-06-14 MED ORDER — BUPIVACAINE LIPOSOME 1.3 % IJ SUSP
INTRAMUSCULAR | Status: DC | PRN
  Administered 2022-06-14: 10 mL via PERINEURAL

## 2022-06-14 MED ORDER — BUPIVACAINE-EPINEPHRINE (PF) 0.25% -1:200000 IJ SOLN
INTRAMUSCULAR | Status: AC
  Filled 2022-06-14: qty 30

## 2022-06-14 MED ORDER — HYDROCHLOROTHIAZIDE 12.5 MG PO TABS
12.5000 mg | ORAL_TABLET | Freq: Every day | ORAL | Status: DC
  Administered 2022-06-15 – 2022-06-16 (×2): 12.5 mg via ORAL
  Filled 2022-06-14 (×2): qty 1

## 2022-06-14 MED ORDER — ORAL CARE MOUTH RINSE: 15.0000 mL | Freq: Once | OROMUCOSAL | Status: AC

## 2022-06-14 MED ORDER — SODIUM CHLORIDE 0.9 % IV SOLN
250.0000 mL | INTRAVENOUS | Status: DC
  Administered 2022-06-14: 250 mL via INTRAVENOUS

## 2022-06-14 MED ORDER — SUGAMMADEX SODIUM 200 MG/2ML IV SOLN
INTRAVENOUS | Status: DC | PRN
  Administered 2022-06-14: 200 mg via INTRAVENOUS

## 2022-06-14 MED ORDER — VENLAFAXINE HCL ER 75 MG PO CP24: 75.0000 mg | ORAL_CAPSULE | ORAL | Status: DC

## 2022-06-14 MED ORDER — TRANEXAMIC ACID-NACL 1000-0.7 MG/100ML-% IV SOLN
1000.0000 mg | INTRAVENOUS | Status: AC
  Administered 2022-06-14: 1000 mg via INTRAVENOUS
  Filled 2022-06-14: qty 100

## 2022-06-14 MED ORDER — BUPIVACAINE-EPINEPHRINE 0.25% -1:200000 IJ SOLN
INTRAMUSCULAR | Status: DC | PRN
  Administered 2022-06-14: 10 mL

## 2022-06-14 MED ORDER — PHENYLEPHRINE 80 MCG/ML (10ML) SYRINGE FOR IV PUSH (FOR BLOOD PRESSURE SUPPORT)
PREFILLED_SYRINGE | INTRAVENOUS | Status: AC
  Filled 2022-06-14: qty 10

## 2022-06-14 MED ORDER — 0.9 % SODIUM CHLORIDE (POUR BTL) OPTIME
TOPICAL | Status: DC | PRN
  Administered 2022-06-14 (×2): 1000 mL

## 2022-06-14 MED ORDER — EPINEPHRINE 0.3 MG/0.3ML IJ SOAJ: 0.3000 mg | INTRAMUSCULAR | Status: DC | PRN

## 2022-06-14 MED ORDER — PHENYLEPHRINE HCL-NACL 20-0.9 MG/250ML-% IV SOLN
INTRAVENOUS | Status: DC | PRN
  Administered 2022-06-14: 40 ug/min via INTRAVENOUS

## 2022-06-14 MED ORDER — PROPOFOL 1000 MG/100ML IV EMUL
INTRAVENOUS | Status: AC
  Filled 2022-06-14: qty 100

## 2022-06-14 MED ORDER — LIDOCAINE 2% (20 MG/ML) 5 ML SYRINGE
INTRAMUSCULAR | Status: DC | PRN
  Administered 2022-06-14: 80 mg via INTRAVENOUS

## 2022-06-14 MED ORDER — ONDANSETRON HCL 4 MG PO TABS: 4.0000 mg | ORAL_TABLET | Freq: Four times a day (QID) | ORAL | Status: DC | PRN

## 2022-06-14 MED ORDER — ENOXAPARIN SODIUM 40 MG/0.4ML IJ SOSY
40.0000 mg | PREFILLED_SYRINGE | INTRAMUSCULAR | Status: DC
  Administered 2022-06-15 – 2022-06-16 (×2): 40 mg via SUBCUTANEOUS
  Filled 2022-06-14 (×2): qty 0.4

## 2022-06-14 MED ORDER — ONDANSETRON HCL 4 MG/2ML IJ SOLN
INTRAMUSCULAR | Status: DC | PRN
  Administered 2022-06-14: 4 mg via INTRAVENOUS

## 2022-06-14 MED ORDER — LISINOPRIL-HYDROCHLOROTHIAZIDE 10-12.5 MG PO TABS: 1.0000 | ORAL_TABLET | Freq: Every morning | ORAL | Status: DC

## 2022-06-14 MED ORDER — SURGIFLO WITH THROMBIN (HEMOSTATIC MATRIX KIT) OPTIME
TOPICAL | Status: DC | PRN
  Administered 2022-06-14: 1 via TOPICAL

## 2022-06-14 MED ORDER — VENLAFAXINE HCL ER 75 MG PO CP24
75.0000 mg | ORAL_CAPSULE | Freq: Every day | ORAL | Status: DC
  Administered 2022-06-15 – 2022-06-16 (×2): 75 mg via ORAL
  Filled 2022-06-14 (×2): qty 1

## 2022-06-14 MED ORDER — LACTATED RINGERS IV SOLN: INTRAVENOUS | Status: DC | PRN

## 2022-06-14 MED ORDER — THROMBIN 20000 UNITS EX SOLR
CUTANEOUS | Status: AC
  Filled 2022-06-14: qty 20000

## 2022-06-14 MED ORDER — DEXAMETHASONE SODIUM PHOSPHATE 10 MG/ML IJ SOLN
INTRAMUSCULAR | Status: DC | PRN
  Administered 2022-06-14: 10 mg via INTRAVENOUS

## 2022-06-14 MED ORDER — ENOXAPARIN SODIUM 40 MG/0.4ML IJ SOSY: 40.0000 mg | PREFILLED_SYRINGE | INTRAMUSCULAR | 0 refills | Status: DC

## 2022-06-14 MED ORDER — DEXMEDETOMIDINE HCL IN NACL 80 MCG/20ML IV SOLN
INTRAVENOUS | Status: DC | PRN
  Administered 2022-06-14: 4 ug via INTRAVENOUS
  Administered 2022-06-14 (×2): 8 ug via INTRAVENOUS

## 2022-06-14 MED ORDER — ONDANSETRON HCL 4 MG/2ML IJ SOLN
INTRAMUSCULAR | Status: AC
  Filled 2022-06-14: qty 2

## 2022-06-14 MED ORDER — VENLAFAXINE HCL ER 37.5 MG PO CP24: 37.5000 mg | ORAL_CAPSULE | ORAL | Status: DC

## 2022-06-14 MED ORDER — ACETAMINOPHEN 325 MG PO TABS
650.0000 mg | ORAL_TABLET | ORAL | Status: DC | PRN
  Administered 2022-06-14 – 2022-06-16 (×3): 650 mg via ORAL
  Filled 2022-06-14 (×3): qty 2

## 2022-06-14 MED ORDER — POLYETHYLENE GLYCOL 3350 17 G PO PACK
17.0000 g | PACK | Freq: Every day | ORAL | Status: DC | PRN
  Administered 2022-06-15 (×2): 17 g via ORAL
  Filled 2022-06-14 (×2): qty 1

## 2022-06-14 MED ORDER — ROCURONIUM BROMIDE 10 MG/ML (PF) SYRINGE
PREFILLED_SYRINGE | INTRAVENOUS | Status: DC | PRN
  Administered 2022-06-14 (×2): 20 mg via INTRAVENOUS
  Administered 2022-06-14: 50 mg via INTRAVENOUS
  Administered 2022-06-14: 30 mg via INTRAVENOUS

## 2022-06-14 MED ORDER — PHENYLEPHRINE 80 MCG/ML (10ML) SYRINGE FOR IV PUSH (FOR BLOOD PRESSURE SUPPORT)
PREFILLED_SYRINGE | INTRAVENOUS | Status: DC | PRN
  Administered 2022-06-14: 80 ug via INTRAVENOUS

## 2022-06-14 MED ORDER — HYDROMORPHONE HCL 1 MG/ML IJ SOLN
0.2500 mg | INTRAMUSCULAR | Status: DC | PRN
  Administered 2022-06-14: 0.25 mg via INTRAVENOUS

## 2022-06-14 MED ORDER — FAMOTIDINE 20 MG PO TABS: 40.0000 mg | ORAL_TABLET | Freq: Every day | ORAL | Status: DC | PRN

## 2022-06-14 MED ORDER — SCOPOLAMINE 1 MG/3DAYS TD PT72
1.0000 | MEDICATED_PATCH | TRANSDERMAL | Status: DC
  Administered 2022-06-14: 1.5 mg via TRANSDERMAL
  Filled 2022-06-14: qty 1

## 2022-06-14 MED ORDER — MAGNESIUM CITRATE PO SOLN: 1.0000 | Freq: Once | ORAL | Status: DC

## 2022-06-14 MED ORDER — DEXAMETHASONE SODIUM PHOSPHATE 10 MG/ML IJ SOLN
INTRAMUSCULAR | Status: AC
  Filled 2022-06-14: qty 1

## 2022-06-14 MED ORDER — OXYCODONE-ACETAMINOPHEN 10-325 MG PO TABS: 1.0000 | ORAL_TABLET | Freq: Four times a day (QID) | ORAL | 0 refills | Status: AC | PRN

## 2022-06-14 MED ORDER — PHENOL 1.4 % MT LIQD: 1.0000 | OROMUCOSAL | Status: DC | PRN

## 2022-06-14 MED ORDER — BISACODYL 5 MG PO TBEC: 5.0000 mg | DELAYED_RELEASE_TABLET | Freq: Every day | ORAL | Status: DC | PRN

## 2022-06-14 MED ORDER — MAGNESIUM CITRATE PO SOLN
1.0000 | Freq: Once | ORAL | Status: AC | PRN
  Administered 2022-06-14: 1 via ORAL
  Filled 2022-06-14: qty 296

## 2022-06-14 MED ORDER — MIDAZOLAM HCL 2 MG/2ML IJ SOLN
INTRAMUSCULAR | Status: AC
  Filled 2022-06-14: qty 2

## 2022-06-14 MED ORDER — PROPOFOL 10 MG/ML IV BOLUS
INTRAVENOUS | Status: DC | PRN
  Administered 2022-06-14: 150 mg via INTRAVENOUS

## 2022-06-14 MED ORDER — PROPOFOL 500 MG/50ML IV EMUL
INTRAVENOUS | Status: DC | PRN
  Administered 2022-06-14: 15 ug/kg/min via INTRAVENOUS

## 2022-06-14 MED ORDER — BUPIVACAINE HCL (PF) 0.25 % IJ SOLN
INTRAMUSCULAR | Status: DC | PRN
  Administered 2022-06-14: 20 mL via PERINEURAL

## 2022-06-14 MED ORDER — CHLORHEXIDINE GLUCONATE 0.12 % MT SOLN
15.0000 mL | Freq: Once | OROMUCOSAL | Status: AC
  Administered 2022-06-14: 15 mL via OROMUCOSAL
  Filled 2022-06-14: qty 15

## 2022-06-14 MED ORDER — ALPRAZOLAM 0.5 MG PO TABS
0.5000 mg | ORAL_TABLET | Freq: Two times a day (BID) | ORAL | Status: DC | PRN
  Administered 2022-06-14 – 2022-06-16 (×4): 0.5 mg via ORAL
  Filled 2022-06-14 (×4): qty 1

## 2022-06-14 MED ORDER — METHOCARBAMOL 1000 MG/10ML IJ SOLN: 500.0000 mg | Freq: Four times a day (QID) | INTRAVENOUS | Status: DC | PRN

## 2022-06-14 MED ORDER — SODIUM CHLORIDE 0.9% FLUSH: 3.0000 mL | INTRAVENOUS | Status: DC | PRN

## 2022-06-14 MED ORDER — ACETAMINOPHEN 500 MG PO TABS
1000.0000 mg | ORAL_TABLET | Freq: Once | ORAL | Status: AC
  Administered 2022-06-14: 1000 mg via ORAL
  Filled 2022-06-14: qty 2

## 2022-06-14 MED ORDER — CEFAZOLIN SODIUM-DEXTROSE 2-4 GM/100ML-% IV SOLN
2.0000 g | INTRAVENOUS | Status: AC
  Administered 2022-06-14: 2 g via INTRAVENOUS
  Filled 2022-06-14: qty 100

## 2022-06-14 SURGICAL SUPPLY — 91 items
AGENT HMST KT MTR STRL THRMB (HEMOSTASIS) ×2
APPLIER CLIP 11 MED OPEN (CLIP) ×2
APR CLP MED 11 20 MLT OPN (CLIP) ×2
BAG COUNTER SPONGE SURGICOUNT (BAG) ×4 IMPLANT
BAG SPNG CNTER NS LX DISP (BAG) ×2
BLADE CLIPPER SURG (BLADE) IMPLANT
BLADE SURG 10 STRL SS (BLADE) ×2 IMPLANT
BUR EGG ELITE 4.0 (BURR) IMPLANT
CABLE BIPOLOR RESECTION CORD (MISCELLANEOUS) ×2 IMPLANT
CLIP APPLIE 11 MED OPEN (CLIP) ×4 IMPLANT
CLIP LIGATING EXTRA MED SLVR (CLIP) IMPLANT
CLSR STERI-STRIP ANTIMIC 1/2X4 (GAUZE/BANDAGES/DRESSINGS) ×4 IMPLANT
COVER ASSEMBLY 12MM (Orthopedic Implant) IMPLANT
COVER SURGICAL LIGHT HANDLE (MISCELLANEOUS) ×2 IMPLANT
DRAPE C-ARM 42X72 X-RAY (DRAPES) ×4 IMPLANT
DRAPE C-ARMOR (DRAPES) ×2 IMPLANT
DRAPE POUCH INSTRU U-SHP 10X18 (DRAPES) ×2 IMPLANT
DRAPE SURG 17X23 STRL (DRAPES) ×2 IMPLANT
DRAPE U-SHAPE 47X51 STRL (DRAPES) ×2 IMPLANT
DRSG OPSITE POSTOP 4X8 (GAUZE/BANDAGES/DRESSINGS) ×2 IMPLANT
DURAPREP 26ML APPLICATOR (WOUND CARE) ×2 IMPLANT
ELECT BLADE 4.0 EZ CLEAN MEGAD (MISCELLANEOUS) ×2
ELECT CAUTERY BLADE 6.4 (BLADE) ×2 IMPLANT
ELECT PENCIL ROCKER SW 15FT (MISCELLANEOUS) ×2 IMPLANT
ELECT REM PT RETURN 9FT ADLT (ELECTROSURGICAL) ×2
ELECTRODE BLDE 4.0 EZ CLN MEGD (MISCELLANEOUS) ×4 IMPLANT
ELECTRODE REM PT RTRN 9FT ADLT (ELECTROSURGICAL) ×2 IMPLANT
GAUZE 4X4 16PLY ~~LOC~~+RFID DBL (SPONGE) IMPLANT
GLOVE BIO SURGEON STRL SZ 6.5 (GLOVE) ×2 IMPLANT
GLOVE BIO SURGEON STRL SZ7.5 (GLOVE) ×2 IMPLANT
GLOVE BIOGEL PI IND STRL 6.5 (GLOVE) ×2 IMPLANT
GLOVE BIOGEL PI IND STRL 8 (GLOVE) ×2 IMPLANT
GLOVE BIOGEL PI IND STRL 8.5 (GLOVE) ×4 IMPLANT
GLOVE SS BIOGEL STRL SZ 8.5 (GLOVE) ×4 IMPLANT
GLOVE SS N UNI LF 7.5 STRL (GLOVE) IMPLANT
GOWN STRL REUS W/ TWL LRG LVL3 (GOWN DISPOSABLE) ×2 IMPLANT
GOWN STRL REUS W/ TWL XL LVL3 (GOWN DISPOSABLE) ×2 IMPLANT
GOWN STRL REUS W/TWL 2XL LVL3 (GOWN DISPOSABLE) ×6 IMPLANT
GOWN STRL REUS W/TWL LRG LVL3 (GOWN DISPOSABLE) ×4
GOWN STRL REUS W/TWL XL LVL3 (GOWN DISPOSABLE) ×2
HEMOSTAT SNOW SURGICEL 2X4 (HEMOSTASIS) IMPLANT
INSERT FOGARTY 61MM (MISCELLANEOUS) IMPLANT
INSERT FOGARTY SM (MISCELLANEOUS) IMPLANT
INTERPLATE 39X12X12 (Plate) ×2 IMPLANT
KIT BASIN OR (CUSTOM PROCEDURE TRAY) ×2 IMPLANT
KIT TURNOVER KIT B (KITS) ×2 IMPLANT
MIX DBX 10CC 35% BONE (Bone Implant) IMPLANT
NDL SPNL 18GX3.5 QUINCKE PK (NEEDLE) ×2 IMPLANT
NEEDLE SPNL 18GX3.5 QUINCKE PK (NEEDLE) ×2 IMPLANT
NS IRRIG 1000ML POUR BTL (IV SOLUTION) ×2 IMPLANT
PACK LAMINECTOMY ORTHO (CUSTOM PROCEDURE TRAY) ×2 IMPLANT
PACK UNIVERSAL I (CUSTOM PROCEDURE TRAY) ×2 IMPLANT
PAD ARMBOARD 7.5X6 YLW CONV (MISCELLANEOUS) ×8 IMPLANT
PEEK SPACER INTERPLAT 35X12X12 (Peek) IMPLANT
PLATE SPINAL INTERPLT 39X12X12 (Plate) IMPLANT
SCREW BONE RESCUE (Screw) IMPLANT
SPONGE INTESTINAL PEANUT (DISPOSABLE) ×8 IMPLANT
SPONGE SURGIFOAM ABS GEL 100 (HEMOSTASIS) IMPLANT
SPONGE T-LAP 18X18 ~~LOC~~+RFID (SPONGE) ×2 IMPLANT
STAPLER VISISTAT 35W (STAPLE) ×2 IMPLANT
SURGIFLO W/THROMBIN 8M KIT (HEMOSTASIS) ×2 IMPLANT
SUT BONE WAX W31G (SUTURE) ×2 IMPLANT
SUT MNCRL AB 3-0 PS2 27 (SUTURE) ×2 IMPLANT
SUT MNCRL+ AB 3-0 CT1 36 (SUTURE) ×2 IMPLANT
SUT PDS AB 1 CTX 36 (SUTURE) ×2 IMPLANT
SUT PROLENE 4 0 RB 1 (SUTURE)
SUT PROLENE 4-0 RB1 .5 CRCL 36 (SUTURE) IMPLANT
SUT PROLENE 5 0 C 1 24 (SUTURE) ×2 IMPLANT
SUT PROLENE 5 0 CC1 (SUTURE) IMPLANT
SUT PROLENE 6 0 C 1 30 (SUTURE) IMPLANT
SUT PROLENE 6 0 CC (SUTURE) IMPLANT
SUT SILK 0 TIES 10X30 (SUTURE) IMPLANT
SUT SILK 2 0 TIES 10X30 (SUTURE) ×4 IMPLANT
SUT SILK 2 0SH CR/8 30 (SUTURE) IMPLANT
SUT SILK 3 0 TIES 10X30 (SUTURE) ×2 IMPLANT
SUT SILK 3 0 TIES 17X18 (SUTURE)
SUT SILK 3 0SH CR/8 30 (SUTURE) IMPLANT
SUT SILK 3-0 18XBRD TIE BLK (SUTURE) IMPLANT
SUT STRATAFIX 1PDS 45CM VIOLET (SUTURE) IMPLANT
SUT VIC AB 1 CT1 27 (SUTURE)
SUT VIC AB 1 CT1 27XBRD ANBCTR (SUTURE) ×4 IMPLANT
SUT VIC AB 2-0 CT1 18 (SUTURE) ×2 IMPLANT
SUT VIC AB 2-0 CT1 27 (SUTURE) ×2
SUT VIC AB 2-0 CT1 TAPERPNT 27 (SUTURE) IMPLANT
SYR BULB IRRIG 60ML STRL (SYRINGE) ×2 IMPLANT
TOWEL GREEN STERILE (TOWEL DISPOSABLE) ×4 IMPLANT
TOWEL GREEN STERILE FF (TOWEL DISPOSABLE) ×2 IMPLANT
TRAY FOL W/BAG SLVR 16FR STRL (SET/KITS/TRAYS/PACK) IMPLANT
TRAY FOLEY MTR SLVR 16FR STAT (SET/KITS/TRAYS/PACK) ×2 IMPLANT
TRAY FOLEY W/BAG SLVR 16FR LF (SET/KITS/TRAYS/PACK) ×2
WATER STERILE IRR 1000ML POUR (IV SOLUTION) ×2 IMPLANT

## 2022-06-14 NOTE — Anesthesia Procedure Notes (Signed)
Procedure Name: Intubation Date/Time: 06/14/2022 7:56 AM  Performed by: Shary Decamp, CRNAPre-anesthesia Checklist: Patient identified, Patient being monitored, Timeout performed, Emergency Drugs available and Suction available Patient Re-evaluated:Patient Re-evaluated prior to induction Oxygen Delivery Method: Circle System Utilized Preoxygenation: Pre-oxygenation with 100% oxygen Induction Type: IV induction Ventilation: Mask ventilation without difficulty Laryngoscope Size: Miller and 2 Grade View: Grade I Tube type: Oral Tube size: 7.0 mm Number of attempts: 1 Airway Equipment and Method: Stylet Placement Confirmation: ETT inserted through vocal cords under direct vision, positive ETCO2 and breath sounds checked- equal and bilateral Secured at: 21 cm Tube secured with: Tape Dental Injury: Teeth and Oropharynx as per pre-operative assessment

## 2022-06-14 NOTE — Discharge Instructions (Signed)

## 2022-06-14 NOTE — Op Note (Signed)
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 Cephus Shelling, MD  Co-surgeon: Dr. Venita Lick, MD  Indications: 57 year old female with chronic lower back pain that has failed conservative management.  She has been evaluated by Dr. Shon Baton who has recommended an L5-S1 ALIF.  Vascular surgery was asked to assist with anterior spine exposure.  She presents after risk benefits discussed.  Findings: Transverse incision over the left rectus muscle at the L5-S1 disc space that was marked with a fluoroscopic C arm in the lateral position.  Anterior rectus sheath was opened and the left rectus muscle was mobilized to the midline.  Entered into the retroperitoneum and mobilized peritoneum and left ureter across midline.  Middle sacral vessels were divided between clips.  Mobilized on both sides of the disc space and put a fixed NuVasive retractor.  Confirmed we were at the correct level with a needle in the disc space.  Anesthesia: General  Details: Patient was taken to the operating room after informed consent was obtained.  Placed on the operative table in supine position.  General endotracheal anesthesia was induced.  Fluoroscopic C-arm was then brought in the lateral position and the L5-S1 disc space was marked over the left rectus muscle.  The abdominal wall was then prepped and draped in standard sterile fashion.  Antibiotics were given and timeout performed.  Initially made a transverse incision at the preoperative mark.  Dissected through the subcutaneous tissue with Bovie cautery and then the anterior rectus sheath on the left side was opened transversely with Bovie cautery.  Use hemostats and raised flaps underneath the anterior rectus sheath.  The left rectus muscle was mobilized to the midline and we entered  lateral to the muscle into the retroperitoneum mobilized peritoneum and left ureter to the midline with blunt dissection using a Kd.  Dr. Shon Baton then used hand-held Wiley retractors to pull the peritoneum and left ureter to the midline.  The middle sacral vessels were divided between clips and then I bluntly mobilized at the L5-S1 disc space including mobilizing the left iliac vein lateral to the disc space.  We then had a good working room anteriorly.  A fixed NuVasive retractor was placed on the field with 140 reverse lips on each side of the disc space as well as a 120 reverse lip cranial and a 140 reverse lip caudal.  We put a needle in the disc space and confirmed we were at the correct L5-S1 level.  Case was turned over to Dr. Shon Baton.  Complications: None  Condition: Stable  Cephus Shelling, MD [facility] Office: [phone]   Cephus Shelling

## 2022-06-14 NOTE — Anesthesia Postprocedure Evaluation (Signed)
Anesthesia Post Note  Patient: Joy Tucker  Procedure(s) Performed: ANTERIOR LUMBAR FUSION  LUMBAR FIVE TO SACRAL ONE (Spine Lumbar) ABDOMINAL EXPOSURE FOR ANTERIOR LUMBAR SPINE SURGERY (Abdomen)     Patient location during evaluation: PACU Anesthesia Type: General Level of consciousness: awake Pain management: pain level controlled Vital Signs Assessment: post-procedure vital signs reviewed and stable Respiratory status: spontaneous breathing, nonlabored ventilation and respiratory function stable Cardiovascular status: blood pressure returned to baseline and stable Postop Assessment: no apparent nausea or vomiting Anesthetic complications: no   No notable events documented.  Last Vitals:  Vitals:   06/14/22 1200 06/14/22 1225  BP: 125/71 108/87  Pulse: 81 81  Resp: 17 18  Temp: 36.7 C   SpO2: 97% 95%    Last Pain:  Vitals:   06/14/22 1200  PainSc: Asleep                 Linton Rump

## 2022-06-14 NOTE — OR Nursing (Signed)
No foreign body seen on post xray per Dr. Deanne Coffer, Radiologist.

## 2022-06-14 NOTE — Brief Op Note (Signed)
06/14/2022  10:43 AM  PATIENT:  Joy Tucker  57 y.o. 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 Chestine Spore to do approach 3 C-Bed left tap block with exparel ABDOMINAL EXPOSURE (N/A)  SURGEON:  Surgeon(s) and Role: Panel 1:    Venita Lick, MD - Primary Panel 2:    Cephus Shelling, MD - Primary  PHYSICIAN ASSISTANT:   ASSISTANTS: none   ANESTHESIA:   general  EBL:  75 mL   BLOOD ADMINISTERED:none  DRAINS: none   LOCAL MEDICATIONS USED:  MARCAINE     SPECIMEN:  No Specimen  DISPOSITION OF SPECIMEN:  N/A  COUNTS:  YES  TOURNIQUET:  * No tourniquets in log *  DICTATION: .Dragon Dictation  PLAN OF CARE: Admit to inpatient   PATIENT DISPOSITION:  PACU - hemodynamically stable.

## 2022-06-14 NOTE — Op Note (Addendum)
OPERATIVE REPORT  DATE OF SURGERY: 06/14/2022  PATIENT NAME:  Joy Tucker MRN: [id number] DOB: Feb 08, 1965  PCP: Salli Real, MD  PRE-OPERATIVE DIAGNOSIS: Degenerative lumbar disc disease L5-S1 with discogenic back pain and neuropathic leg pain  POST-OPERATIVE DIAGNOSIS: Same  PROCEDURE:   ALIF L5-S1  SURGEON:  Venita Lick, MD  PHYSICIAN ASSISTANT: None  Approach surgeon: Dr. Sherald Hess  ANESTHESIA:   General  EBL: 75 ml   Complications: None  Implants: RSB intervertebral cage.  12 x 35.  12 degree lordosis.  Anterior 0 profile lumbar plate: 12 x 39.  12 degree lordosis.   5x 25 mm length locking screws x 3.  Graft: DBX mix  BRIEF HISTORY: Joy Tucker is a 57 y.o. female who presented to my office with chronic debilitating back pain.  Despite appropriate conservative management her overall quality of life continued to deteriorate.  As a result of the failure of conservative management we elected to move forward with surgery.  All appropriate risks, benefits, and alternatives to surgery were discussed and consent was obtained.  PROCEDURE DETAILS: Patient was brought into the operating room and was properly positioned on the operating room table.  After induction with general anesthesia the patient was endotracheally intubated.  A timeout was taken to confirm all important data: including patient, procedure, and the level. Teds, SCD's were applied.   A Foley was inserted by the nurse and the care abdomen was prepped and draped in a standard fashion.  Using fluoroscopy we identified our incision site.  The incision was infiltrated with quarter percent Marcaine with epinephrine.  Dr. Chestine Spore then performed a standard anterior retroperitoneal approach to the lumbar spine.  I assisted him by holding appropriate retraction for visualization.  Please refer to his dictation for specifics on the approach.  Once the self-retaining retractor system was assembled and positioned on  the could visualize the L5-S1 disc space.  A needle was placed into the disc space and a lateral x-ray was taken confirming that we are at the appropriate level.  At this point time Dr. Chestine Spore scrubbed out and I continued with the surgery.  Annulotomy was performed at L5-S1 and I remove the bulk of the disc material with pituitary rongeurs.  Then using curettes I continued to work posteriorly.  I distraction bullet was placed which allowed me to continue to work posteriorly.  Ultimately I was able to remove all of the disc material and expose the posterior annulus.  A small curved curette was then placed behind the vertebral body of S1 and I released the annulus.  Bone spurs were removed with a 2 mm Kerrison rongeur to adequately complete the decompression/discectomy.  A lamina spreader was placed in the disc space and using live fluoroscopy I confirmed that parallel endplate distraction.  At this point I was quite pleased with the overall discectomy.  Trialing devices were then placed.  The 12 mm large lordotic thoracic trial provided the best overall fit as well as recreation of normal disc height and indirect restoration of foraminal space.  A 14 smooth 12 degree lordotic trial was attempted but it did not properly fit.  It was very difficult to just initiate the insertion.  Because of the oversized implant I elected to use the 12 lordotic 35 implant.  The implant was obtained and packed with DBX mix.  The wound was copiously irrigated with normal saline and I used my nerve hook to check 1 final time posteriorly that there was no  fragments of bone or disc material.  I then inserted the cage to the appropriate depth.  The 0 profile anterior plate was then inserted over the cage.  I then placed the locking screws to into the body of L5 and a single screw into the body of S1.  All 3 screws had excellent purchase.  I then placed the locking Which was secured according manufacture standards.  The wound was then  copiously irrigated with normal saline and I confirmed that hemostasis using direct visualization and bipolar cautery as well as Floseal.  I sequentially removed all of the retractors and confirm there was no bleeding.  With all the retractors removed I irrigated 1 final time and then closed the fascia of the rectus with a running #1 strata fix suture.  I then closed superficial with interrupted 2-0 Vicryl suture.  An AP x-ray was taken and read by the radiologist confirming there were no retained surgical instruments in the wound.  The skin edges were then closed with a running 3-0 Monocryl stitch.  Steri-Strips and dry dressings were applied and the patient was ultimately extubated and transferred to the PACU without incident.  Venita Lick, MD 06/14/2022 10:21 AM

## 2022-06-14 NOTE — H&P (Signed)
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  The various methods of treatment have been discussed with the patient and family. After consideration of risks, benefits and other options for treatment, the patient has consented to  Procedure(s) with comments: ANTERIOR LUMBAR FUSION 1 LEVEL L5-S1 (N/A) - 4 hrs Dr. Chestine Spore to do approach 3 C-Bed left tap block with exparel ABDOMINAL EXPOSURE (N/A) as a surgical intervention.  The patient's history has been reviewed, patient examined, no change in status, stable for surgery.  I have reviewed the patient's chart and labs.  Questions were answered to the patient's satisfaction.    Anterior spine exposure for L5-S1 ALIF  Cephus Shelling        Patient name: Joy Tucker       MRN: [id number]        DOB: 28-Jul-1965          Sex: female   REASON FOR CONSULT: Abdominal exposure for L5-S1 ALIF   HPI: Joy Tucker is a 57 y.o. female, with history of anxiety and chronic lower back pain that presents for preop evaluation of abdominal exposure for L5-S1 ALIF.  Patient has been under the care of Dr. Shon Baton.  She describes chronic lower back pain and has failed conservative management.  She had L5-S1 intradiscal injection which provided near complete relief of her pain.  Dr. Shon Baton has recommended an L5-S1 ALIF.  She has had 2 previous C-sections as her only abdominal surgery.       Past Medical History:  Diagnosis Date   Anxiety     Arthritis     Complication of anesthesia     Depression     Family history of adverse reaction to anesthesia      nausea and vomiting   GERD (gastroesophageal reflux disease)     IBS (irritable bowel syndrome)     IBS (irritable bowel syndrome)     PONV (postoperative nausea and vomiting)             Past Surgical History:  Procedure Laterality Date   BREAST  SURGERY        x2 -left biopsy-benign   CESAREAN SECTION   [id number]   DILATION AND CURETTAGE OF UTERUS       meniscus knee repair       TOTAL KNEE ARTHROPLASTY Left 06/06/2017    Procedure: LEFT TOTAL KNEE ARTHROPLASTY;  Surgeon: Durene Romans, MD;  Location: [facility];  Service: Orthopedics;  Laterality: Left;      No family history on file.   SOCIAL HISTORY: Social History         Socioeconomic History   Marital status: Married      Spouse name: Not on file   Number of children: Not on file   Years of education: Not on file   Highest education level: Not on file  Occupational History   Not on file  Tobacco Use   Smoking status: Former      Types: Cigarettes      Quit date: 05/18/2017      Years since quitting: 5.0   Smokeless tobacco: Never  Vaping Use   Vaping Use: Never used  Substance and Sexual Activity   Alcohol use: Yes   Drug use: No   Sexual activity: Not on file  Other Topics Concern   Not on file  Social  History Narrative   Not on file    Social Determinants of Health    Financial Resource Strain: Not on file  Food Insecurity: Not on file  Transportation Needs: Not on file  Physical Activity: Not on file  Stress: Not on file  Social Connections: Not on file  Intimate Partner Violence: Not on file           Allergies  Allergen Reactions   Mushroom Extract Complex Anaphylaxis   Other Anaphylaxis      Mushrooms     Promethazine Hcl Other (See Comments)   Latex Itching      Turns skin red            REVIEW OF SYSTEMS:  [X]  denotes positive finding, [ ]  denotes negative finding Cardiac   Comments:  Chest pain or chest pressure:      Shortness of breath upon exertion:      Short of breath when lying flat:      Irregular heart rhythm:             Vascular      Pain in calf, thigh, or hip brought on by ambulation:      Pain in feet at night that wakes you up from your sleep:       Blood clot in your veins:      Leg swelling:               Pulmonary      Oxygen at home:      Productive cough:       Wheezing:              Neurologic      Sudden weakness in arms or legs:       Sudden numbness in arms or legs:       Sudden onset of difficulty speaking or slurred speech:      Temporary loss of vision in one eye:       Problems with dizziness:              Gastrointestinal      Blood in stool:       Vomited blood:              Genitourinary      Burning when urinating:       Blood in urine:             Psychiatric      Major depression:              Hematologic      Bleeding problems:      Problems with blood clotting too easily:             Skin      Rashes or ulcers:             Constitutional      Fever or chills:          PHYSICAL EXAM:    Vitals:    06/01/22 1542  BP: 120/80  Pulse: 93  Resp: 16  Temp: 97.8 F (36.6 C)  TempSrc: Temporal  SpO2: 99%  Weight: 152 lb (68.9 kg)  Height: 5\' 4"  (1.626 m)      GENERAL: The patient is a well-nourished female, in no acute distress. The vital signs are documented above. CARDIAC: There is a regular rate and rhythm.  VASCULAR:  Bilateral femoral pulses palpable Bilateral DP PT pulses palpable PULMONARY: No respiratory distress. ABDOMEN:  Soft and non-tender.Prior C-section scar MUSCULOSKELETAL: There are no major deformities or cyanosis. NEUROLOGIC: No focal weakness or paresthesias are detected. SKIN: There are no ulcers or rashes noted. PSYCHIATRIC: The patient has a normal affect.   DATA:    Most recent MRI reviewed      Assessment/Plan:   57 year old female with chronic lower back pain that presents for evaluation for anterior spine exposure for L5-S1 ALIF.  I reviewed her MRI and discussed she would be a good candidate for anterior approach.  I discussed transverse incision over the left rectus muscle and then mobilizing the rectus muscle to enter into the retroperitoneum and mobilize peritoneum and left ureter across midline.  Discussed  mobilizing iliac artery and vein.  Discussed risk of injury to the above structures.  Discussed risk of hernia.  Look forward to assisting Dr. Shon Baton.  All questions answered.     Cephus Shelling, MD [facility] Office: [phone]

## 2022-06-14 NOTE — H&P (Signed)
History:  Joy Tucker is a very pleasant 57 year old woman with ongoing severe back buttock and neuropathic leg pain. Despite appropriate conservative management her overall quality of life is continued to deteriorate. As result we have elected to move forward with surgery. Plan on anterior lumbar interbody fusion L5-S1  Past Medical History:  Diagnosis Date   Anxiety    Arthritis    Complication of anesthesia    Depression    Family history of adverse reaction to anesthesia    nausea and vomiting   GERD (gastroesophageal reflux disease)    Hypertension    IBS (irritable bowel syndrome)    IBS (irritable bowel syndrome)    PONV (postoperative nausea and vomiting)     Allergies  Allergen Reactions   Mushroom Extract Complex Anaphylaxis   Other Anaphylaxis    Mushrooms    Promethazine Hcl Other (See Comments)    Hallucinations Heart palpitations flailing of arms and legs   Latex Itching    Turns skin red    No current facility-administered medications on file prior to encounter.   Current Outpatient Medications on File Prior to Encounter  Medication Sig Dispense Refill   Alpha-D-Galactosidase (BEANO PO) Take 1 tablet by mouth as needed (for gas).      APPLE CIDER VINEGAR PO Take 1 capsule by mouth daily.     cetirizine (ZYRTEC) 10 MG tablet Take 10 mg by mouth daily as needed for allergies.     doxylamine, Sleep, (UNISOM) 25 MG tablet Take 25 mg by mouth at bedtime as needed for sleep.     EPINEPHrine (EPIPEN) 0.3 mg/0.3 mL DEVI Inject 0.3 mLs (0.3 mg total) into the muscle as needed (for severe allergic reaction). 3 Device 1   fluocinonide (LIDEX) 0.05 % external solution Apply 1 Application topically daily as needed (Eczema).     Probiotic CAPS Take 1 capsule by mouth daily. Align pre-pro      Physical Exam: Vitals:   06/14/22 0621  BP: (!) 144/95  Pulse: 79  Resp: 16  Temp: 98.3 F (36.8 C)  SpO2: 99%   Body mass index is 24.89 kg/m. Clinical exam:  Joy Tucker is a pleasant individual, who appears younger than their stated age.  She is alert and orientated 3.  No shortness of breath, chest pain.  Abdomen is soft and non-tender, negative loss of bowel and bladder control, no rebound tenderness.  Negative: skin lesions abrasions contusions  Peripheral pulses: 2+ peripheral pulses. LE compartments are: Soft and nontender.  Gait pattern: normal  Assistive devices: none  Neuro: 5/5 motor strength in the lower extremity bilaterally. Negative straight leg raise test, no clonus, positive intermittent dysesthesias and neuropathic leg pain. Negative Babinski test, 2+ deep tendon reflexes.  Musculoskeletal: significant low back pain with palpation and range of motion. No SI joint pain.  Imaging: X-rays of the lumbar spine demonstrate normal sagittal alignment. No scoliosis or spondylolisthesis. Patient does have degenerative lumbar disc disease at L5-S1 with loss of normal disc space height.  Lumbar MRI: completed on 06/10/22. Mild to moderate disc space narrowing throughout except severe degenerative disease at L5-S1. Small posterior lateral disc protrusion at L4-5. There is a central disc protrusion which contacts the L5 nerve root bilaterally but does not significantly impinge upon. Mild to moderate central stenosis at L3-4.  No change from 08/2021 MRI  Intradiscal injection performed on 11/10/2021: Patient reports near complete relief of her pain and improvement in her quality of life for 5 days.  A/P: Summary: Joy Tucker is a very pleasant 57 year old woman who complains of severe ongoing debilitating back pain. She has had multiple courses of formalized physical therapy as well as different injections. Her quality of life has failed to improve. She had a recent L5-S1 intradiscal injection which provided near complete relief of her pain for 5 days. Currently her pain score is an 8/10.  At this point time we have discussed treatment options  which include fusion surgery versus chronic pain medical management. She is elected to move forward with surgery. She has been unable to work and her overall outlook is poor.  Surgical procedure I recommended is an ALIF L5-S1. This would allow removal of the painful disc and restoration of the disc height to restore the foraminal space. This would allow for indirect neural decompression to address her episodic radicular leg pain.  I have gone over the surgery in great detail with the patient and her daughter and all of their questions were addressed. Risks and benefits of surgery were discussed with the patient. These include: Infection, bleeding, death, stroke, paralysis, ongoing or worse pain, need for additional surgery, nonunion, leak of spinal fluid, adjacent segment degeneration requiring additional fusion surgery, need for posterior decompression and/or fusion. Bleeding from major vessels, and blood clots (deep venous thrombosis)requiring additional treatment, loss in bowel and bladder control, injury to bladder, ureters, and other major abdominal organs.

## 2022-06-14 NOTE — Transfer of Care (Signed)
Immediate Anesthesia Transfer of Care Note  Patient: Joy Tucker  Procedure(s) Performed: ANTERIOR LUMBAR FUSION 1 LEVEL L5-S1 ABDOMINAL EXPOSURE  Patient Location: PACU  Anesthesia Type:GA combined with regional for post-op pain  Level of Consciousness: awake, alert , oriented, patient cooperative, and responds to stimulation  Airway & Oxygen Therapy: Patient Spontanous Breathing and Patient connected to nasal cannula oxygen  Post-op Assessment: Report given to RN, Post -op Vital signs reviewed and stable, and Patient moving all extremities X 4  Post vital signs: Reviewed and stable  Last Vitals:  Vitals Value Taken Time  BP 124/87 06/14/22 1032  Temp    Pulse 94 06/14/22 1032  Resp 15 06/14/22 1032  SpO2 98 % 06/14/22 1032  Vitals shown include unvalidated device data.  Last Pain:  Vitals:   06/14/22 0621  PainSc: 6       Patients Stated Pain Goal: 4 (06/14/22 1610)  Complications: No notable events documented.

## 2022-06-14 NOTE — Anesthesia Procedure Notes (Addendum)
Anesthesia Regional Block: TAP block   Pre-Anesthetic Checklist: , timeout performed,  Correct Patient, Correct Site, Correct Laterality,  Correct Procedure, Correct Position, site marked,  Risks and benefits discussed,  Surgical consent,  Pre-op evaluation,  At surgeon's request and post-op pain management  Laterality: Left  Prep: chloraprep       Needles:  Injection technique: Single-shot  Needle Type: Echogenic Stimulator Needle     Needle Length: 9cm  Needle Gauge: 21     Additional Needles:   Procedures:,,,, ultrasound used (permanent image in chart),,    Narrative:  Start time: 06/14/2022 7:16 AM End time: 06/14/2022 7:19 AM Injection made incrementally with aspirations every 5 mL.  Performed by: Personally  Anesthesiologist: Linton Rump, MD  Additional Notes: Discussed risks and benefits of nerve block including, but not limited to, prolonged and/or permanent nerve injury involving sensory and/or motor function. Monitors were applied and a time-out was performed. The nerve and associated structures were visualized under ultrasound guidance. After negative aspiration, local anesthetic was slowly injected around the nerve. There was no evidence of high pressure during the procedure. There were no paresthesias. VSS remained stable and the patient tolerated the procedure well.

## 2022-06-15 ENCOUNTER — Observation Stay (HOSPITAL_BASED_OUTPATIENT_CLINIC_OR_DEPARTMENT_OTHER): Payer: Managed Care, Other (non HMO)

## 2022-06-15 DIAGNOSIS — Z981 Arthrodesis status: Secondary | ICD-10-CM

## 2022-06-15 DIAGNOSIS — M545 Low back pain, unspecified: Secondary | ICD-10-CM | POA: Diagnosis not present

## 2022-06-15 MED FILL — Thrombin For Soln 20000 Unit: CUTANEOUS | Qty: 1 | Status: AC

## 2022-06-15 NOTE — Progress Notes (Signed)
    Subjective: Procedure(s) (LRB): ANTERIOR LUMBAR FUSION  LUMBAR FIVE TO SACRAL ONE (N/A) ABDOMINAL EXPOSURE FOR ANTERIOR LUMBAR SPINE SURGERY (N/A) 1 Day Post-Op  Patient reports pain as 3 on 0-10 scale.  Reports decreased leg pain reports incisional back pain   Positive void Positive bowel movement Positive flatus Negative chest pain or shortness of breath  Objective: Vital signs in last 24 hours: Temp:  [97.5 F (36.4 C)-98.2 F (36.8 C)] 98.1 F (36.7 C) (05/21 0758) Pulse Rate:  [72-100] 91 (05/21 0758) Resp:  [10-20] 19 (05/21 0758) BP: (108-130)/(70-87) 109/72 (05/21 0758) SpO2:  [93 %-100 %] 100 % (05/21 0758)  Intake/Output from previous day: 05/20 0701 - 05/21 0700 In: 3209.1 [P.O.:960; I.V.:1999.1; IV Piggyback:250] Out: 226 [Urine:151; Blood:75]  Labs: Recent Labs    06/14/22 1301  WBC 8.7  RBC 3.71*  HCT 37.2  PLT 218   Recent Labs    06/14/22 1301  CREATININE 0.76   No results for input(s): "LABPT", "INR" in the last 72 hours.  Physical Exam: Neurologically intact Neurovascular intact Intact pulses distally Incision: dressing C/D/I and no drainage Compartment soft Body mass index is 24.89 kg/m.   Assessment/Plan: Patient stable Continue mobilization with physical therapy Continue care  1.  Plan on lower extremity Doppler test today. 2.  Continue mobilization. 3.  Plan on discharge to home tomorrow.  Venita Lick, MD [facility] [phone]

## 2022-06-15 NOTE — Evaluation (Signed)
Physical Therapy Evaluation  Patient Details Name: Joy Tucker MRN: [id number] DOB: 1965-03-01 Today's Date: 06/15/2022  History of Present Illness  Pt is a 57 y/o female who presents s/p L5-S1 ALIF on 06/14/2022. PMH significant for anxiety, depression, IBS, L knee arthroplasty   Clinical Impression  Pt admitted with above diagnosis. At the time of PT eval, pt was able to demonstrate transfers and ambulation with gross min guard assist to supervision for safety and RW for support. Pt was educated on precautions, brace application/wearing schedule, appropriate activity progression, and car transfer. Pt currently with functional limitations due to the deficits listed below (see PT Problem List). Pt will benefit from skilled PT to increase their independence and safety with mobility to allow discharge to the venue listed below.         Recommendations for follow up therapy are one component of a multi-disciplinary discharge planning process, led by the attending physician.  Recommendations may be updated based on patient status, additional functional criteria and insurance authorization.  Follow Up Recommendations       Assistance Recommended at Discharge PRN  Patient can return home with the following  A little help with walking and/or transfers;A little help with bathing/dressing/bathroom;Assistance with cooking/housework;Assist for transportation;Help with stairs or ramp for entrance    Equipment Recommendations None recommended by PT  Recommendations for Other Services       Functional Status Assessment Patient has had a recent decline in their functional status and demonstrates the ability to make significant improvements in function in a reasonable and predictable amount of time.     Precautions / Restrictions Precautions Precautions: Back;Fall Precaution Booklet Issued: Yes (comment) Precaution Comments: Severe anxiety Required Braces or Orthoses: Spinal Brace Spinal Brace:  Lumbar corset;Applied in sitting position Restrictions Weight Bearing Restrictions: No Other Position/Activity Restrictions: no BLT      Mobility  Bed Mobility Overal bed mobility: Modified Independent             General bed mobility comments: HOB elevated but overall good demonstration of log roll technique to/from EOB.    Transfers Overall transfer level: Needs assistance Equipment used: Rolling walker (2 wheels) Transfers: Sit to/from Stand Sit to Stand: Supervision           General transfer comment: VC's for hand placement on seated surface for safety.    Ambulation/Gait Ambulation/Gait assistance: Min guard Gait Distance (Feet): 500 Feet Assistive device: Rolling walker (2 wheels) Gait Pattern/deviations: Step-through pattern, Decreased stride length, Trunk flexed Gait velocity: Decreased Gait velocity interpretation: 1.31 - 2.62 ft/sec, indicative of limited community ambulator   General Gait Details: VC's for improved posture, closer walker proximity, and forward gaze.  Stairs            Wheelchair Mobility    Modified Rankin (Stroke Patients Only)       Balance Overall balance assessment: Needs assistance Sitting-balance support: Feet supported, No upper extremity supported Sitting balance-Leahy Scale: Fair     Standing balance support: Bilateral upper extremity supported, During functional activity, Reliant on assistive device for balance Standing balance-Leahy Scale: Poor                               Pertinent Vitals/Pain Pain Assessment Pain Assessment: Faces Faces Pain Scale: Hurts little more Pain Location: Back at op site and groin Pain Descriptors / Indicators: Sore, Aching Pain Intervention(s): Monitored during session, Limited activity within patient's tolerance, Repositioned  Home Living Family/patient expects to be discharged to:: Private residence Living Arrangements: Children (9 year old daughter is  home for the summer) Available Help at Discharge: Family Type of Home: House Home Access: Stairs to enter Entrance Stairs-Rails:  (Designer, industrial/product on garage entrance) Entrance Stairs-Number of Steps: 2-3   Home Layout: Two level;Able to live on main level with bedroom/bathroom Home Equipment: Rolling Walker (2 wheels);Cane - single point;BSC/3in1      Prior Function Prior Level of Function : Independent/Modified Independent;Driving             Mobility Comments: no AD ADLs Comments: ind in bADLs/iADLs     Hand Dominance        Extremity/Trunk Assessment   Upper Extremity Assessment Upper Extremity Assessment: Generalized weakness    Lower Extremity Assessment Lower Extremity Assessment: Generalized weakness    Cervical / Trunk Assessment Cervical / Trunk Assessment: Back Surgery  Communication   Communication: No difficulties  Cognition Arousal/Alertness: Awake/alert Behavior During Therapy: WFL for tasks assessed/performed Overall Cognitive Status: Within Functional Limits for tasks assessed                                          General Comments General comments (skin integrity, edema, etc.): VSS on RA>    Exercises     Assessment/Plan    PT Assessment Patient needs continued PT services  PT Problem List Decreased strength;Decreased activity tolerance;Decreased balance;Decreased mobility;Decreased knowledge of use of DME;Decreased safety awareness;Decreased knowledge of precautions;Pain       PT Treatment Interventions DME instruction;Gait training;Functional mobility training;Therapeutic activities;Therapeutic exercise;Stair training;Balance training;Patient/family education    PT Goals (Current goals can be found in the Care Plan section)  Acute Rehab PT Goals Patient Stated Goal: Home tomorrow PT Goal Formulation: With patient Time For Goal Achievement: 06/22/22 Potential to Achieve Goals: Good    Frequency Min 5X/week      Co-evaluation               AM-PAC PT "6 Clicks" Mobility  Outcome Measure Help needed turning from your back to your side while in a flat bed without using bedrails?: A Little Help needed moving from lying on your back to sitting on the side of a flat bed without using bedrails?: A Little Help needed moving to and from a bed to a chair (including a wheelchair)?: A Little Help needed standing up from a chair using your arms (e.g., wheelchair or bedside chair)?: A Little Help needed to walk in hospital room?: A Little Help needed climbing 3-5 steps with a railing? : A Little 6 Click Score: 18    End of Session Equipment Utilized During Treatment: Back brace Activity Tolerance: Patient tolerated treatment well Patient left: in bed;with call bell/phone within reach Nurse Communication: Mobility status PT Visit Diagnosis: Unsteadiness on feet (R26.81);Pain Pain - part of body:  (back, abdomen)    Time: 1000-1031 PT Time Calculation (min) (ACUTE ONLY): 31 min   Charges:   PT Evaluation $PT Eval Low Complexity: 1 Low PT Treatments $Gait Training: 8-22 mins        Conni Slipper, PT, DPT Acute Rehabilitation Services Secure Chat Preferred Office: [phone]   Joy Tucker 06/15/2022, 10:40 AM

## 2022-06-15 NOTE — Evaluation (Signed)
Occupational Therapy Evaluation and DC Summary Patient Details Name: Joy Tucker MRN: [id number] DOB: [birth year] Today's Date: 06/15/2022   History of Present Illness 57 y.o. female who presented with chronic debilitating back pain and neuropathic leg pain. She is now s/p L5-S1 lumbar fusion. PMH significant of anxiety, depression, IBS, GERD, L knee arthroplasty   Clinical Impression   Pt admitted for above dx, PTA patient lived alone but her daughter will be available 24/7 upon DC for summer break from college. Pt reports ambulating no AD and being independent/mod I in bADLs/iADLs. Pt educated on POB precautions and demonstrated good carryover of functional transfers and complete dressing while maintaining back precautions. Pt needed 1 verbal cue to remind herself no twisting during activity, later pt recalled 3/3 back precautions and demonstrated understanding by end of session. Pt also demonstrated ability to doff/don brace with supervision, no cueing needed. Advised pt to continue with use of RW at home due to impaired balance/stability without it at this time. Pt has no further acute skilled OT needs at this time, no follow-up OT recommended.       Recommendations for follow up therapy are one component of a multi-disciplinary discharge planning process, led by the attending physician.  Recommendations may be updated based on patient status, additional functional criteria and insurance authorization.   Assistance Recommended at Discharge PRN  Patient can return home with the following Assistance with cooking/housework;Assist for transportation    Functional Status Assessment  Patient has not had a recent decline in their functional status  Equipment Recommendations  None recommended by OT (Pt has rec DME)    Recommendations for Other Services       Precautions / Restrictions Precautions Precautions: Back Precaution Booklet Issued: Yes (comment) Required Braces or Orthoses:  Spinal Brace Spinal Brace: Lumbar corset Restrictions Weight Bearing Restrictions: No Other Position/Activity Restrictions: no BLT      Mobility Bed Mobility Overal bed mobility: Modified Independent             General bed mobility comments: Pt demo'd good use go log roll for sit<>supine    Transfers Overall transfer level: Modified independent Equipment used: Rolling walker (2 wheels)                      Balance Overall balance assessment: Mild deficits observed, not formally tested (Balance without RW is just fair, pt seems a bit unsteady. recommeneded use of RW for time being)                                         ADL either performed or assessed with clinical judgement   ADL Overall ADL's : Modified independent;At baseline                                       General ADL Comments: Educated pt on POB precautions, demonstrated proper use of compensatory strategies to complete functional mobility, lower body dressing, hand hygiene and toileting. Ambulating with Supervision + RW     Vision Baseline Vision/History: 1 Wears glasses       Perception     Praxis      Pertinent Vitals/Pain Pain Assessment Pain Assessment: Faces Faces Pain Scale: Hurts little more Pain Location: Back at op site and groin Pain Descriptors / Indicators:  Sore, Aching Pain Intervention(s): Limited activity within patient's tolerance, Monitored during session, Premedicated before session     Hand Dominance     Extremity/Trunk Assessment Upper Extremity Assessment Upper Extremity Assessment: Overall WFL for tasks assessed   Lower Extremity Assessment Lower Extremity Assessment: Defer to PT evaluation   Cervical / Trunk Assessment Cervical / Trunk Assessment: Back Surgery   Communication Communication Communication: No difficulties   Cognition Arousal/Alertness: Awake/alert Behavior During Therapy: WFL for tasks  assessed/performed Overall Cognitive Status: Within Functional Limits for tasks assessed                                       General Comments  VSS on RA>    Exercises     Shoulder Instructions      Home Living Family/patient expects to be discharged to:: Private residence Living Arrangements: Children (daughter is home for the summer)   Type of Home: House Home Access: Stairs to enter [employer] of Steps: 2-3 Entrance Stairs-Rails:  (hand protector on garage entrance) Home Layout: Two level;Able to live on main level with bedroom/bathroom     Bathroom Shower/Tub: Producer, television/film/video: Standard Bathroom Accessibility: Yes How Accessible: Accessible via walker Home Equipment: Rolling Walker (2 wheels);Cane - single point;BSC/3in1          Prior Functioning/Environment Prior Level of Function : Independent/Modified Independent;Driving             Mobility Comments: ind no AD ADLs Comments: ind in bADLs/iADLs        OT Problem List: Pain;Impaired balance (sitting and/or standing)      OT Treatment/Interventions:      OT Goals(Current goals can be found in the care plan section) Acute Rehab OT Goals Patient Stated Goal: To go home OT Goal Formulation: With patient Time For Goal Achievement: 06/29/22 Potential to Achieve Goals: Good  OT Frequency:      Co-evaluation              AM-PAC OT "6 Clicks" Daily Activity     Outcome Measure Help from another person eating meals?: None Help from another person taking care of personal grooming?: None Help from another person toileting, which includes using toliet, bedpan, or urinal?: None Help from another person bathing (including washing, rinsing, drying)?: None Help from another person to put on and taking off regular upper body clothing?: None Help from another person to put on and taking off regular lower body clothing?: None 6 Click Score: 24   End of Session  Equipment Utilized During Treatment: Gait belt;Rolling walker (2 wheels) Nurse Communication: Mobility status  Activity Tolerance: Patient tolerated treatment well Patient left: in bed;with call bell/phone within reach  OT Visit Diagnosis: Unsteadiness on feet (R26.81);Pain                Time: 1610-9604 OT Time Calculation (min): 26 min Charges:  OT General Charges $OT Visit: 1 Visit OT Evaluation $OT Eval Moderate Complexity: 1 Mod OT Treatments $Self Care/Home Management : 8-22 mins  06/15/2022  AB, OTR/L  Acute Rehabilitation Services  Office: [phone]   Tristan Schroeder 06/15/2022, 9:44 AM

## 2022-06-15 NOTE — Progress Notes (Signed)
BLE venous duplex has been completed.   Results can be found under chart review under CV PROC. 06/15/2022 4:05 PM Zona Pedro RVT, RDMS

## 2022-06-15 NOTE — Progress Notes (Signed)
[facility]  Subjective  -states she was constipated last night.   Objective 130/76 83 97.6 F (36.4 C) (Oral) 20 100%  Intake/Output Summary (Last 24 hours) at 06/15/2022 0636 Last data filed at 06/14/2022 2130 Gross per 24 hour  Intake 3209.14 ml  Output 226 ml  Net 2983.14 ml    Abdominal incision clean dry and intact with no hematoma Left DP palpable  Laboratory Lab Results: Recent Labs    06/14/22 1301  WBC 8.7  HGB 12.5  HCT 37.2  PLT 218   BMET Recent Labs    06/14/22 1301  CREATININE 0.76    COAG No results found for: "INR", "PROTIME" No results found for: "PTT"  Assessment/Planning:  Postop day 1 status post anterior spine exposure for L5-S1 ALIF.  Overall looks good this morning.  Abdomen soft with appropriate postop incisional tenderness.  Incision looks good.  Palpable DP pulse.  Looks good from our standpoint.  Cephus Shelling 06/15/2022 6:36 AM --

## 2022-06-16 DIAGNOSIS — M545 Low back pain, unspecified: Secondary | ICD-10-CM | POA: Diagnosis not present

## 2022-06-16 MED FILL — Sodium Chloride IV Soln 0.9%: INTRAVENOUS | Qty: 1000 | Status: AC

## 2022-06-16 MED FILL — Heparin Sodium (Porcine) Inj 1000 Unit/ML: INTRAMUSCULAR | Qty: 30 | Status: AC

## 2022-06-16 NOTE — Progress Notes (Signed)
    Subjective: Procedure(s) (LRB): ANTERIOR LUMBAR FUSION  LUMBAR FIVE TO SACRAL ONE (N/A) ABDOMINAL EXPOSURE FOR ANTERIOR LUMBAR SPINE SURGERY (N/A) 2 Days Post-Op  Patient reports pain as 4 on 0-10 scale.  Reports decreased leg pain reports incisional back pain   Positive void Negative bowel movement Positive flatus Negative chest pain or shortness of breath  Objective: Vital signs in last 24 hours: Temp:  [97.5 F (36.4 C)-99.4 F (37.4 C)] 98.4 F (36.9 C) (05/22 0806) Pulse Rate:  [74-92] 86 (05/22 0806) Resp:  [16-20] 16 (05/22 0806) BP: (95-107)/(62-76) 101/69 (05/22 0806) SpO2:  [95 %-100 %] 99 % (05/22 0806)  Intake/Output from previous day: 05/21 0701 - 05/22 0700 In: 480 [P.O.:480] Out: -   Labs: Recent Labs    06/14/22 1301  WBC 8.7  RBC 3.71*  HCT 37.2  PLT 218   Recent Labs    06/14/22 1301  CREATININE 0.76   No results for input(s): "LABPT", "INR" in the last 72 hours.  Physical Exam: Neurologically intact ABD soft Intact pulses distally Dorsiflexion/Plantar flexion intact Incision: dressing C/D/I and no drainage Body mass index is 24.89 kg/m.   Assessment/Plan: Patient stable  Doppler study negative Continue mobilization with physical therapy Continue care  Patient notes that between her anxiety and pain she is having difficulty sleeping.  But it is slowly improving. I did discuss the fact that there is a low census on the floor and she may be transferred to another unit.  I expressed some concerns about certain units and she indicated that she would rather be discharged home to be transferred which I think is reasonable. She has been educated on how to use the Lovenox.  She has had a positive bowel movement and is voiding spontaneously.  She is also walking independently with minimal assistance. Patient will be discharged today provided she meets all appropriate criteria and she will follow-up with me as scheduled.  Venita Lick,  MD [facility] [phone]

## 2022-06-16 NOTE — Plan of Care (Signed)

## 2022-06-16 NOTE — Discharge Summary (Signed)
Patient ID: Joy Tucker MRN: [id number] DOB/AGE: 06-30-1965 57 y.o.  Admit date: 06/14/2022 Discharge date: 06/16/2022  Admission Diagnoses:  Principal Problem:   S/P lumbar fusion   Discharge Diagnoses:  Principal Problem:   S/P lumbar fusion  status post Procedure(s): ANTERIOR LUMBAR FUSION  LUMBAR FIVE TO SACRAL ONE ABDOMINAL EXPOSURE FOR ANTERIOR LUMBAR SPINE SURGERY  Past Medical History:  Diagnosis Date   Anxiety    Arthritis    Complication of anesthesia    Depression    Family history of adverse reaction to anesthesia    nausea and vomiting   GERD (gastroesophageal reflux disease)    Hypertension    IBS (irritable bowel syndrome)    IBS (irritable bowel syndrome)    PONV (postoperative nausea and vomiting)     Surgeries: Procedure(s): ANTERIOR LUMBAR FUSION  LUMBAR FIVE TO SACRAL ONE ABDOMINAL EXPOSURE FOR ANTERIOR LUMBAR SPINE SURGERY on 06/14/2022   Consultants: Treatment Team:  Cephus Shelling, MD  Discharged Condition: Improved  Hospital Course: [name] is an 57 y.o. female who was admitted 06/14/2022 for operative treatment of S/P lumbar fusion. Patient failed conservative treatments (please see the history and physical for the specifics) and had severe unremitting pain that affects sleep, daily activities and work/hobbies. After pre-op clearance, the patient was taken to the operating room on 06/14/2022 and underwent  Procedure(s): ANTERIOR LUMBAR FUSION  LUMBAR FIVE TO SACRAL ONE ABDOMINAL EXPOSURE FOR ANTERIOR LUMBAR SPINE SURGERY.    Patient was given perioperative antibiotics:  Anti-infectives (From admission, onward)    Start     Dose/Rate Route Frequency Ordered Stop   06/14/22 1600  ceFAZolin (ANCEF) IVPB 1 g/50 mL premix        1 g 100 mL/hr over 30 Minutes Intravenous Every 8 hours 06/14/22 1211 06/14/22 2352   06/14/22 0607  ceFAZolin (ANCEF) IVPB 2g/100 mL premix        2 g 200 mL/hr over 30 Minutes Intravenous 30 min  pre-op 06/14/22 5784 06/14/22 0820        Patient was given sequential compression devices and early ambulation to prevent DVT.   Patient benefited maximally from hospital stay and there were no complications. At the time of discharge, the patient was urinating/moving their bowels without difficulty, tolerating a regular diet, pain is controlled with oral pain medications and they have been cleared by PT/OT.   Recent vital signs: Patient Vitals for the past 24 hrs:  BP Temp Temp src Pulse Resp SpO2  06/16/22 0642 101/65 99.4 F (37.4 C) Oral 92 18 95 %  06/15/22 2321 107/76 98.3 F (36.8 C) Oral 89 20 100 %  06/15/22 1928 95/62 98.4 F (36.9 C) Oral 83 18 97 %  06/15/22 1744 102/75 (!) 97.5 F (36.4 C) -- 87 16 100 %  06/15/22 1450 96/63 -- -- 74 16 98 %     Recent laboratory studies:  Recent Labs    06/14/22 1301  WBC 8.7  HGB 12.5  HCT 37.2  PLT 218  CREATININE 0.76     Discharge Medications:   Allergies as of 06/16/2022       Reactions   Mushroom Extract Complex Anaphylaxis   Other Anaphylaxis   Mushrooms   Promethazine Hcl Other (See Comments)   Hallucinations Heart palpitations flailing of arms and legs   Latex Itching   Turns skin red        Medication List     STOP taking these medications  ibuprofen 800 MG tablet Commonly known as: ADVIL       TAKE these medications    ALPRAZolam 0.5 MG tablet Commonly known as: XANAX Take 0.5 mg by mouth 2 (two) times daily as needed for anxiety.   APPLE CIDER VINEGAR PO Take 1 capsule by mouth daily.   BEANO PO Take 1 tablet by mouth as needed (for gas).   cetirizine 10 MG tablet Commonly known as: ZYRTEC Take 10 mg by mouth daily as needed for allergies.   doxylamine (Sleep) 25 MG tablet Commonly known as: UNISOM Take 25 mg by mouth at bedtime as needed for sleep.   enoxaparin 40 MG/0.4ML injection Commonly known as: LOVENOX Inject 0.4 mLs (40 mg total) into the skin daily for 10 days. 10  day supply 1 injection per day   EPINEPHrine 0.3 mg/0.3 mL Devi Commonly known as: EpiPen Inject 0.3 mLs (0.3 mg total) into the muscle as needed (for severe allergic reaction).   fluocinonide 0.05 % external solution Commonly known as: LIDEX Apply 1 Application topically daily as needed (Eczema).   HYDROCORTISONE-ALOE VERA EX Apply 1 application  topically daily as needed (itching).   lisinopril-hydrochlorothiazide 10-12.5 MG tablet Commonly known as: ZESTORETIC Take 1 tablet by mouth in the morning.   methocarbamol 500 MG tablet Commonly known as: ROBAXIN Take 1 tablet (500 mg total) by mouth every 8 (eight) hours as needed for up to 5 days for muscle spasms.   ondansetron 4 MG tablet Commonly known as: Zofran Take 1 tablet (4 mg total) by mouth every 8 (eight) hours as needed for nausea or vomiting.   oxyCODONE-acetaminophen 10-325 MG tablet Commonly known as: Percocet Take 1 tablet by mouth every 6 (six) hours as needed for up to 5 days for pain.   Pepcid 40 MG tablet Generic drug: famotidine Take 40 mg by mouth daily as needed for heartburn or indigestion.   Probiotic Caps Take 1 capsule by mouth daily. Align pre-pro   simethicone 125 MG chewable tablet Commonly known as: MYLICON Chew 125 mg by mouth every 6 (six) hours as needed for flatulence.   venlafaxine XR 37.5 MG 24 hr capsule Commonly known as: EFFEXOR-XR Take 37.5 mg by mouth See admin instructions. Take with 75 mg for a total of 112.5 mg in the morning   venlafaxine XR 75 MG 24 hr capsule Commonly known as: EFFEXOR-XR Take 75 mg by mouth See admin instructions. Take with 37.5 mg for a total of 112.5 mg in the morning        Diagnostic Studies: VAS [place] LOWER EXTREMITY VENOUS (DVT)  Result Date: 06/15/2022  Lower Venous DVT Study Patient Name:  Joy Tucker  Date of Exam:   06/15/2022 Medical Rec #: [id number]        Accession #:    [id number] Date of Birth: [birth year]        Patient Gender: F  Patient Age:   57 years Exam Location:  [facility] Procedure:      VAS [place] LOWER EXTREMITY VENOUS (DVT) Referring Phys: Debria Garret Day Greb --------------------------------------------------------------------------------  Indications: Post-op.  Risk Factors: Surgery ALIF L5-S1 post-op day 1. Comparison Study: No previous exams Performing Technologist: Jody Hill RVT, RDMS  Examination Guidelines: A complete evaluation includes B-mode imaging, spectral Doppler, color Doppler, and power Doppler as needed of all accessible portions of each vessel. Bilateral testing is considered an integral part of a complete examination. Limited examinations for reoccurring indications may be performed as noted. The reflux portion of  the exam is performed with the patient in reverse Trendelenburg.  +---------+---------------+---------+-----------+----------+--------------+ RIGHT    CompressibilityPhasicitySpontaneityPropertiesThrombus Aging +---------+---------------+---------+-----------+----------+--------------+ CFV      Full           Yes      Yes                                 +---------+---------------+---------+-----------+----------+--------------+ SFJ      Full                                                        +---------+---------------+---------+-----------+----------+--------------+ FV Prox  Full           Yes      Yes                                 +---------+---------------+---------+-----------+----------+--------------+ FV Mid   Full           Yes      Yes                                 +---------+---------------+---------+-----------+----------+--------------+ FV DistalFull           Yes      Yes                                 +---------+---------------+---------+-----------+----------+--------------+ PFV      Full                                                        +---------+---------------+---------+-----------+----------+--------------+ POP      Full            Yes      Yes                                 +---------+---------------+---------+-----------+----------+--------------+ PTV      Full                                                        +---------+---------------+---------+-----------+----------+--------------+ PERO     Full                                                        +---------+---------------+---------+-----------+----------+--------------+   +---------+---------------+---------+-----------+----------+--------------+ LEFT     CompressibilityPhasicitySpontaneityPropertiesThrombus Aging +---------+---------------+---------+-----------+----------+--------------+ CFV      Full           Yes      Yes                                 +---------+---------------+---------+-----------+----------+--------------+  SFJ      Full                                                        +---------+---------------+---------+-----------+----------+--------------+ FV Prox  Full           Yes      Yes                                 +---------+---------------+---------+-----------+----------+--------------+ FV Mid   Full           Yes      Yes                                 +---------+---------------+---------+-----------+----------+--------------+ FV DistalFull           Yes      Yes                                 +---------+---------------+---------+-----------+----------+--------------+ PFV      Full                                                        +---------+---------------+---------+-----------+----------+--------------+ POP      Full           Yes      Yes                                 +---------+---------------+---------+-----------+----------+--------------+ PTV      Full                                                        +---------+---------------+---------+-----------+----------+--------------+ PERO     Full                                                         +---------+---------------+---------+-----------+----------+--------------+     Summary: BILATERAL: - No evidence of deep vein thrombosis seen in the lower extremities, bilaterally. -No evidence of popliteal cyst, bilaterally.   *See table(s) above for measurements and observations. Electronically signed by Coral Else MD on 06/15/2022 at 7:19:26 PM.    Final    DG Lumbar Spine 2-3 Views  Result Date: 06/14/2022 CLINICAL DATA:  [id number] Elective surgery [id number] EXAM: LUMBAR SPINE - 2-3 VIEW COMPARISON:  None Available. FINDINGS: Intraoperative images during anterior fusion at L5-S1. Hardware is intact. No evidence of immediate hardware complication. IMPRESSION: Intraoperative images during L5-S1 lumbosacral fusion. Hardware is intact without evidence of immediate complication. Electronically Signed   By: Caprice Renshaw M.D.   On: 06/14/2022 10:42   DG C-Arm 1-60 Min-No  Report  Result Date: 06/14/2022 Fluoroscopy was utilized by the requesting physician.  No radiographic interpretation.   DG C-Arm 1-60 Min-No Report  Result Date: 06/14/2022 Fluoroscopy was utilized by the requesting physician.  No radiographic interpretation.   DG OR LOCAL ABDOMEN  Result Date: 06/14/2022 CLINICAL DATA:  Anterior lumbar fusion EXAM: OR LOCAL ABDOMEN COMPARISON:  10/16/2012 FINDINGS: Surgical hardware and clips project over the L5 vertebral body and first sacral segment. No unexpected foreign body. Normal bowel gas pattern. IMPRESSION: Surgical hardware and clips as above. Report was called to OR at the  time of interpretation. Electronically Signed   By: Corlis Leak M.D.   On: 06/14/2022 10:13    Discharge Instructions     Incentive spirometry RT   Complete by: As directed         Follow-up Information     Venita Lick, MD. Schedule an appointment as soon as possible for a visit in 2 week(s).   Specialty: Orthopedic Surgery Why: If symptoms worsen, For suture removal, For wound re-check Contact  information: [address] [place] [address] [phone]                 Discharge Plan:  discharge to home  Disposition: Eera was seen today and she was doing quite well.  Her anxiety is still an issue but we are controlling it with her home medications (Xanax, and Effexor).  The patient will be evaluated again by physical therapy to confirm that she is safe for discharge to home.  Her dressings are clean dry and intact, she is voiding spontaneously, and she has had a bowel movement.  Her pain is controlled with oral medications.  She still notes discomfort but I do believe this will continue to improve.  She has been educated on how to use Lovenox.    Signed: Alvy Beal for Dr. Venita Lick [facility] [phone] 06/16/2022, 8:06 AM

## 2022-06-16 NOTE — Progress Notes (Signed)
Patient alert and oriented, voiding adequately, skin clean, dry and intact without evidence of skin break down, or symptoms of complications - no redness or edema noted, only slight tenderness at site.  Patient states pain is manageable at time of discharge. Patient has an appointment with MD in 2 weeks 

## 2022-06-16 NOTE — Progress Notes (Signed)
Physical Therapy Treatment Patient Details Name: Joy Tucker MRN: [id number] DOB: 24-Sep-1965 Today's Date: 06/16/2022   History of Present Illness Pt is a 57 y/o female who presents s/p L5-S1 ALIF on 06/14/2022. PMH significant for anxiety, depression, IBS, L knee arthroplasty    PT Comments    Pt progressing towards physical therapy goals. Completed stair training and pt ambulating well. Requires frequent cues for improved posture, closer walker proximity, and forward gaze. Increased time spent educating pt on bed mobility from high bed height with HOB flat and rails lowered to simulate home environment. Pt is safe for d/c from PT standpoint. Will continue to follow.    Recommendations for follow up therapy are one component of a multi-disciplinary discharge planning process, led by the attending physician.  Recommendations may be updated based on patient status, additional functional criteria and insurance authorization.  Follow Up Recommendations       Assistance Recommended at Discharge PRN  Patient can return home with the following A little help with walking and/or transfers;A little help with bathing/dressing/bathroom;Assistance with cooking/housework;Assist for transportation;Help with stairs or ramp for entrance   Equipment Recommendations  None recommended by PT    Recommendations for Other Services       Precautions / Restrictions Precautions Precautions: Back;Fall Precaution Booklet Issued: Yes (comment) Precaution Comments: Severe anxiety Required Braces or Orthoses: Spinal Brace Spinal Brace: Lumbar corset;Applied in sitting position Restrictions Weight Bearing Restrictions: No     Mobility  Bed Mobility Overal bed mobility: Modified Independent             General bed mobility comments: Overall good demonstration of log roll technique to/from EOB. Practiced with HOB flat, rails lowered and elevated bed height to simulate home environment.     Transfers Overall transfer level: Needs assistance Equipment used: Rolling walker (2 wheels) Transfers: Sit to/from Stand Sit to Stand: Supervision           General transfer comment: VC's for hand placement on seated surface for safety.    Ambulation/Gait Ambulation/Gait assistance: Min guard Gait Distance (Feet): 350 Feet Assistive device: Rolling walker (2 wheels) Gait Pattern/deviations: Step-through pattern, Decreased stride length, Trunk flexed Gait velocity: Decreased Gait velocity interpretation: 1.31 - 2.62 ft/sec, indicative of limited community ambulator   General Gait Details: VC's for improved posture, closer walker proximity, and forward gaze.   Stairs Stairs: Yes Stairs assistance: Min guard Stair Management: One rail Right, Step to pattern, Forwards Number of Stairs: 3 General stair comments: VC's for sequencing and general safety.   Wheelchair Mobility    Modified Rankin (Stroke Patients Only)       Balance Overall balance assessment: Needs assistance Sitting-balance support: Feet supported, No upper extremity supported Sitting balance-Leahy Scale: Fair     Standing balance support: Bilateral upper extremity supported, During functional activity, Reliant on assistive device for balance Standing balance-Leahy Scale: Poor                              Cognition Arousal/Alertness: Awake/alert Behavior During Therapy: WFL for tasks assessed/performed Overall Cognitive Status: Within Functional Limits for tasks assessed                                          Exercises      General Comments        Pertinent Vitals/Pain  Pain Assessment Pain Assessment: Faces Faces Pain Scale: Hurts little more Pain Location: Back at op site and groin Pain Descriptors / Indicators: Sore, Aching Pain Intervention(s): Limited activity within patient's tolerance, Monitored during session, Repositioned    Home Living  Family/patient expects to be discharged to:: Private residence Living Arrangements: Children (105 year old daughter is home for the summer) Available Help at Discharge: Family Type of Home: House Home Access: Stairs to enter Entrance Stairs-Rails:  (Designer, industrial/product on garage entrance) Entrance Stairs-Number of Steps: 2-3   Home Layout: Two level;Able to live on main level with bedroom/bathroom Home Equipment: Rolling Walker (2 wheels);Cane - single point;BSC/3in1      Prior Function            PT Goals (current goals can now be found in the care plan section) Acute Rehab PT Goals Patient Stated Goal: Home tomorrow PT Goal Formulation: With patient Time For Goal Achievement: 06/22/22 Potential to Achieve Goals: Good Progress towards PT goals: Progressing toward goals    Frequency    Min 5X/week      PT Plan Current plan remains appropriate    Co-evaluation              AM-PAC PT "6 Clicks" Mobility   Outcome Measure  Help needed turning from your back to your side while in a flat bed without using bedrails?: A Little Help needed moving from lying on your back to sitting on the side of a flat bed without using bedrails?: A Little Help needed moving to and from a bed to a chair (including a wheelchair)?: A Little Help needed standing up from a chair using your arms (e.g., wheelchair or bedside chair)?: A Little Help needed to walk in hospital room?: A Little Help needed climbing 3-5 steps with a railing? : A Little 6 Click Score: 18    End of Session Equipment Utilized During Treatment: Back brace Activity Tolerance: Patient tolerated treatment well Patient left: in bed;with call bell/phone within reach Nurse Communication: Mobility status PT Visit Diagnosis: Unsteadiness on feet (R26.81);Pain Pain - part of body:  (back, abdomen)     Time: 1610-9604 PT Time Calculation (min) (ACUTE ONLY): 23 min  Charges:  $Gait Training: 23-37 mins                      Conni Slipper, PT, DPT Acute Rehabilitation Services Secure Chat Preferred Office: [phone]    Joy Tucker 06/16/2022, 11:06 AM

## 2022-06-21 ENCOUNTER — Encounter (HOSPITAL_COMMUNITY): Payer: Self-pay | Admitting: Orthopedic Surgery

## 2022-06-22 ENCOUNTER — Encounter (HOSPITAL_COMMUNITY): Payer: Self-pay | Admitting: Orthopedic Surgery

## 2022-09-21 NOTE — Progress Notes (Signed)
COVID Vaccine Completed:  Date of COVID positive in last 90 days:  PCP - Salli Real, MD Cardiologist -   Chest x-ray -  EKG - 06/04/22 Epic Stress Test -  ECHO -  Cardiac Cath -  Pacemaker/ICD device last checked: Spinal Cord Stimulator:  Bowel Prep -   Sleep Study -  CPAP -   Fasting Blood Sugar -  Checks Blood Sugar _____ times a day  Last dose of GLP1 agonist-  N/A GLP1 instructions:  N/A   Last dose of SGLT-2 inhibitors-  N/A SGLT-2 instructions: N/A   Blood Thinner Instructions:  Time Aspirin Instructions: Last Dose:  Activity level:  Can go up a flight of stairs and perform activities of daily living without stopping and without symptoms of chest pain or shortness of breath.  Able to exercise without symptoms  Unable to go up a flight of stairs without symptoms of     Anesthesia review:   Patient denies shortness of breath, fever, cough and chest pain at PAT appointment  Patient verbalized understanding of instructions that were given to them at the PAT appointment. Patient was also instructed that they will need to review over the PAT instructions again at home before surgery.

## 2022-09-21 NOTE — Patient Instructions (Signed)
SURGICAL WAITING ROOM VISITATION  Patients having surgery or a procedure may have no more than 2 support people in the waiting area - these visitors may rotate.    Children under the age of 73 must have an adult with them who is not the patient.  Due to an increase in RSV and influenza rates and associated hospitalizations, children ages 39 and under may not visit patients in [facility] hospitals.  If the patient needs to stay at the hospital during part of their recovery, the visitor guidelines for inpatient rooms apply. Pre-op nurse will coordinate an appropriate time for 1 support person to accompany patient in pre-op.  This support person may not rotate.    Please refer to the [facility] website for the visitor guidelines for Inpatients (after your surgery is over and you are in a regular room).    Your procedure is scheduled on: 09/28/22   Report to [facility] Main Entrance    Report to admitting at 6:00 AM   Call this number if you have problems the morning of surgery [phone]   Do not eat food :After Midnight.   After Midnight you may have the following liquids until 5:30 AM DAY OF SURGERY  Water Non-Citrus Juices (without pulp, NO RED-Apple, White grape, White cranberry) Black Coffee (NO MILK/CREAM OR CREAMERS, sugar ok)  Clear Tea (NO MILK/CREAM OR CREAMERS, sugar ok) regular and decaf                             Plain Jell-O (NO RED)                                           Fruit ices (not with fruit pulp, NO RED)                                     Popsicles (NO RED)                                                               Sports drinks like Gatorade (NO RED)    The day of surgery:  Drink ONE (1) Pre-Surgery Clear Ensure at 5:30 AM the morning of surgery. Drink in one sitting. Do not sip.  This drink was given to you during your hospital  pre-op appointment visit. Nothing else to drink after completing the  Pre-Surgery Clear Ensure.          If  you have questions, please contact your surgeon's office.   FOLLOW BOWEL PREP AND ANY ADDITIONAL PRE OP INSTRUCTIONS YOU RECEIVED FROM YOUR SURGEON'S OFFICE!!!     Oral Hygiene is also important to reduce your risk of infection.                                    Remember - BRUSH YOUR TEETH THE MORNING OF SURGERY WITH YOUR REGULAR TOOTHPASTE  DENTURES WILL BE REMOVED PRIOR TO SURGERY PLEASE DO NOT APPLY "Poly grip" OR ADHESIVES!!!  Stop all vitamins and herbal supplements 7 days before surgery.   Take these medicines the morning of surgery with A SIP OF WATER: Alprazolam, Zyrtec, Famotidine, Zofran, Venlafaxine                               You may not have any metal on your body including hair pins, jewelry, and body piercing             Do not wear make-up, lotions, powders, perfumes, or deodorant  Do not wear nail polish including gel and S&S, artificial/acrylic nails, or any other type of covering on natural nails including finger and toenails. If you have artificial nails, gel coating, etc. that needs to be removed by a nail salon please have this removed prior to surgery or surgery may need to be canceled/ delayed if the surgeon/ anesthesia feels like they are unable to be safely monitored.   Do not shave  48 hours prior to surgery.    Do not bring valuables to the hospital. [facility] IS NOT             RESPONSIBLE   FOR VALUABLES.   Contacts, glasses, dentures or bridgework may not be worn into surgery.   Bring small overnight bag day of surgery.   DO NOT BRING YOUR HOME MEDICATIONS TO THE HOSPITAL. PHARMACY WILL DISPENSE MEDICATIONS LISTED ON YOUR MEDICATION LIST TO YOU DURING YOUR ADMISSION IN THE HOSPITAL!              Please read over the following fact sheets you were given: IF YOU HAVE QUESTIONS ABOUT YOUR PRE-OP INSTRUCTIONS PLEASE CALL [phone]    If you received a COVID test during your pre-op visit  it is requested that you wear a mask when out in  public, stay away from anyone that may not be feeling well and notify your surgeon if you develop symptoms. If you test positive for Covid or have been in contact with anyone that has tested positive in the last 10 days please notify you surgeon.      Pre-operative 5 CHG Bath Instructions   You can play a key role in reducing the risk of infection after surgery. Your skin needs to be as free of germs as possible. You can reduce the number of germs on your skin by washing with CHG (chlorhexidine gluconate) soap before surgery. CHG is an antiseptic soap that kills germs and continues to kill germs even after washing.   DO NOT use if you have an allergy to chlorhexidine/CHG or antibacterial soaps. If your skin becomes reddened or irritated, stop using the CHG and notify one of our RNs at [phone].   Please shower with the CHG soap starting 4 days before surgery using the following schedule:     Please keep in mind the following:  DO NOT shave, including legs and underarms, starting the day of your first shower.   You may shave your face at any point before/day of surgery.  Place clean sheets on your bed the day you start using CHG soap. Use a clean washcloth (not used since being washed) for each shower. DO NOT sleep with pets once you start using the CHG.   CHG Shower Instructions:  If you choose to wash your hair and private area, wash first with your normal shampoo/soap.  After you use shampoo/soap, rinse your hair and body thoroughly to remove shampoo/soap residue.  Turn the water OFF and apply about 3 tablespoons (45 ml) of CHG soap to a CLEAN washcloth.  Apply CHG soap ONLY FROM YOUR NECK DOWN TO YOUR TOES (washing for 3-5 minutes)  DO NOT use CHG soap on face, private areas, open wounds, or sores.  Pay special attention to the area where your surgery is being performed.  If you are having back surgery, having someone wash your back for you may be helpful. Wait 2 minutes after CHG  soap is applied, then you may rinse off the CHG soap.  Pat dry with a clean towel  Put on clean clothes/pajamas   If you choose to wear lotion, please use ONLY the CHG-compatible lotions on the back of this paper.     Additional instructions for the day of surgery: DO NOT APPLY any lotions, deodorants, cologne, or perfumes.   Put on clean/comfortable clothes.  Brush your teeth.  Ask your nurse before applying any prescription medications to the skin.      CHG Compatible Lotions   Aveeno Moisturizing lotion  Cetaphil Moisturizing Cream  Cetaphil Moisturizing Lotion  Clairol Herbal Essence Moisturizing Lotion, Dry Skin  Clairol Herbal Essence Moisturizing Lotion, Extra Dry Skin  Clairol Herbal Essence Moisturizing Lotion, Normal Skin  Curel Age Defying Therapeutic Moisturizing Lotion with Alpha Hydroxy  Curel Extreme Care Body Lotion  Curel Soothing Hands Moisturizing Hand Lotion  Curel Therapeutic Moisturizing Cream, Fragrance-Free  Curel Therapeutic Moisturizing Lotion, Fragrance-Free  Curel Therapeutic Moisturizing Lotion, Original Formula  Eucerin Daily Replenishing Lotion  Eucerin Dry Skin Therapy Plus Alpha Hydroxy Crme  Eucerin Dry Skin Therapy Plus Alpha Hydroxy Lotion  Eucerin Original Crme  Eucerin Original Lotion  Eucerin Plus Crme Eucerin Plus Lotion  Eucerin TriLipid Replenishing Lotion  Keri Anti-Bacterial Hand Lotion  Keri Deep Conditioning Original Lotion Dry Skin Formula Softly Scented  Keri Deep Conditioning Original Lotion, Fragrance Free Sensitive Skin Formula  Keri Lotion Fast Absorbing Fragrance Free Sensitive Skin Formula  Keri Lotion Fast Absorbing Softly Scented Dry Skin Formula  Keri Original Lotion  Keri Skin Renewal Lotion Keri Silky Smooth Lotion  Keri Silky Smooth Sensitive Skin Lotion  Nivea Body Creamy Conditioning Oil  Nivea Body Extra Enriched Teacher, adult education Moisturizing Lotion Nivea Crme  Nivea  Skin Firming Lotion  NutraDerm 30 Skin Lotion  NutraDerm Skin Lotion  NutraDerm Therapeutic Skin Cream  NutraDerm Therapeutic Skin Lotion  ProShield Protective Hand Cream  Provon moisturizing lotion  WHAT IS A BLOOD TRANSFUSION? Blood Transfusion Information  A transfusion is the replacement of blood or some of its parts. Blood is made up of multiple cells which provide different functions. Red blood cells carry oxygen and are used for blood loss replacement. White blood cells fight against infection. Platelets control bleeding. Plasma helps clot blood. Other blood products are available for specialized needs, such as hemophilia or other clotting disorders. BEFORE THE TRANSFUSION  Who gives blood for transfusions?  Healthy volunteers who are fully evaluated to make sure their blood is safe. This is blood bank blood. Transfusion therapy is the safest it has ever been in the practice of medicine. Before blood is taken from a donor, a complete history is taken to make sure that person has no history of diseases nor engages in risky social behavior (examples are intravenous drug use or sexual activity with multiple partners). The donor's travel history is screened to minimize risk of transmitting infections, such as malaria. The donated blood is tested  for signs of infectious diseases, such as HIV and hepatitis. The blood is then tested to be sure it is compatible with you in order to minimize the chance of a transfusion reaction. If you or a relative donates blood, this is often done in anticipation of surgery and is not appropriate for emergency situations. It takes many days to process the donated blood. RISKS AND COMPLICATIONS Although transfusion therapy is very safe and saves many lives, the main dangers of transfusion include:  Getting an infectious disease. Developing a transfusion reaction. This is an allergic reaction to something in the blood you were given. Every precaution is taken to  prevent this. The decision to have a blood transfusion has been considered carefully by your caregiver before blood is given. Blood is not given unless the benefits outweigh the risks. AFTER THE TRANSFUSION Right after receiving a blood transfusion, you will usually feel much better and more energetic. This is especially true if your red blood cells have gotten low (anemic). The transfusion raises the level of the red blood cells which carry oxygen, and this usually causes an energy increase. The nurse administering the transfusion will monitor you carefully for complications. HOME CARE INSTRUCTIONS  No special instructions are needed after a transfusion. You may find your energy is better. Speak with your caregiver about any limitations on activity for underlying diseases you may have. SEEK MEDICAL CARE IF:  Your condition is not improving after your transfusion. You develop redness or irritation at the intravenous (IV) site. SEEK IMMEDIATE MEDICAL CARE IF:  Any of the following symptoms occur over the next 12 hours: Shaking chills. You have a temperature by mouth above 102 F (38.9 C), not controlled by medicine. Chest, back, or muscle pain. People around you feel you are not acting correctly or are confused. Shortness of breath or difficulty breathing. Dizziness and fainting. You get a rash or develop hives. You have a decrease in urine output. Your urine turns a dark color or changes to pink, red, or brown. Any of the following symptoms occur over the next 10 days: You have a temperature by mouth above 102 F (38.9 C), not controlled by medicine. Shortness of breath. Weakness after normal activity. The white part of the eye turns yellow (jaundice). You have a decrease in the amount of urine or are urinating less often. Your urine turns a dark color or changes to pink, red, or brown. Document Released: 01/09/2000 Document Revised: 04/05/2011 Document Reviewed: 08/28/2007 ExitCare  Patient Information 2014 [place], [place].  _______________________________________________________________________  Incentive Spirometer  An incentive spirometer is a tool that can help keep your lungs clear and active. This tool measures how well you are filling your lungs with each breath. Taking long deep breaths may help reverse or decrease the chance of developing breathing (pulmonary) problems (especially infection) following: A long period of time when you are unable to move or be active. BEFORE THE PROCEDURE  If the spirometer includes an indicator to show your best effort, your nurse or respiratory therapist will set it to a desired goal. If possible, sit up straight or lean slightly forward. Try not to slouch. Hold the incentive spirometer in an upright position. INSTRUCTIONS FOR USE  Sit on the edge of your bed if possible, or sit up as far as you can in bed or on a chair. Hold the incentive spirometer in an upright position. Breathe out normally. Place the mouthpiece in your mouth and seal your lips tightly around it. Breathe in  slowly and as deeply as possible, raising the piston or the ball toward the top of the column. Hold your breath for 3-5 seconds or for as long as possible. Allow the piston or ball to fall to the bottom of the column. Remove the mouthpiece from your mouth and breathe out normally. Rest for a few seconds and repeat Steps 1 through 7 at least 10 times every 1-2 hours when you are awake. Take your time and take a few normal breaths between deep breaths. The spirometer may include an indicator to show your best effort. Use the indicator as a goal to work toward during each repetition. After each set of 10 deep breaths, practice coughing to be sure your lungs are clear. If you have an incision (the cut made at the time of surgery), support your incision when coughing by placing a pillow or rolled up towels firmly against it. Once you are able to get out of bed,  walk around indoors and cough well. You may stop using the incentive spirometer when instructed by your caregiver.  RISKS AND COMPLICATIONS Take your time so you do not get dizzy or light-headed. If you are in pain, you may need to take or ask for pain medication before doing incentive spirometry. It is harder to take a deep breath if you are having pain. AFTER USE Rest and breathe slowly and easily. It can be helpful to keep track of a log of your progress. Your caregiver can provide you with a simple table to help with this. If you are using the spirometer at home, follow these instructions: SEEK MEDICAL CARE IF:  You are having difficultly using the spirometer. You have trouble using the spirometer as often as instructed. Your pain medication is not giving enough relief while using the spirometer. You develop fever of 100.5 F (38.1 C) or higher. SEEK IMMEDIATE MEDICAL CARE IF:  You cough up bloody sputum that had not been present before. You develop fever of 102 F (38.9 C) or greater. You develop worsening pain at or near the incision site. MAKE SURE YOU:  Understand these instructions. Will watch your condition. Will get help right away if you are not doing well or get worse. Document Released: 05/24/2006 Document Revised: 04/05/2011 Document Reviewed: 07/25/2006 [facility] Patient Information 2014 [place], [place].   ________________________________________________________________________

## 2022-09-22 ENCOUNTER — Other Ambulatory Visit: Payer: Self-pay

## 2022-09-22 ENCOUNTER — Encounter (HOSPITAL_COMMUNITY): Payer: Self-pay

## 2022-09-22 ENCOUNTER — Encounter (HOSPITAL_COMMUNITY)
Admission: RE | Admit: 2022-09-22 | Discharge: 2022-09-22 | Disposition: A | Discharge status: Home / Self Care | Payer: Managed Care, Other (non HMO) | Source: Ambulatory Visit | Attending: Orthopedic Surgery

## 2022-09-22 VITALS — BP 141/90 | HR 86 | Temp 97.9°F | Resp 18 | Ht 64.75 in | Wt 152.0 lb

## 2022-09-22 DIAGNOSIS — Z01812 Encounter for preprocedural laboratory examination: Secondary | ICD-10-CM | POA: Insufficient documentation

## 2022-09-22 DIAGNOSIS — M1611 Unilateral primary osteoarthritis, right hip: Secondary | ICD-10-CM

## 2022-09-22 DIAGNOSIS — I10 Essential (primary) hypertension: Secondary | ICD-10-CM | POA: Diagnosis not present

## 2022-09-22 DIAGNOSIS — Z01818 Encounter for other preprocedural examination: Secondary | ICD-10-CM

## 2022-09-22 HISTORY — DX: Pneumonia, unspecified organism: J18.9

## 2022-09-22 HISTORY — DX: Cardiac murmur, unspecified: R01.1

## 2022-09-22 LAB — CBC
HCT: 39.6 % (ref 36.0–46.0)
Hemoglobin: 13.3 g/dL (ref 12.0–15.0)
MCH: 33.3 pg (ref 26.0–34.0)
MCHC: 33.6 g/dL (ref 30.0–36.0)
MCV: 99.2 fL (ref 80.0–100.0)
Platelets: 229 10*3/uL (ref 150–400)
RBC: 3.99 MIL/uL (ref 3.87–5.11)
RDW: 12.1 % (ref 11.5–15.5)
WBC: 5.5 10*3/uL (ref 4.0–10.5)
nRBC: 0 % (ref 0.0–0.2)

## 2022-09-22 LAB — BASIC METABOLIC PANEL
Anion gap: 12 (ref 5–15)
BUN: 22 mg/dL — ABNORMAL HIGH (ref 6–20)
CO2: 24 mmol/L (ref 22–32)
Calcium: 9.7 mg/dL (ref 8.9–10.3)
Chloride: 100 mmol/L (ref 98–111)
Creatinine, Ser: 0.71 mg/dL (ref 0.44–1.00)
GFR, Estimated: >60 mL/min (ref >60)
Glucose, Bld: 105 mg/dL — ABNORMAL HIGH (ref 70–99)
Potassium: 4.1 mmol/L (ref 3.5–5.1)
Sodium: 136 mmol/L (ref 135–145)

## 2022-09-22 LAB — SURGICAL PCR SCREEN
MRSA, PCR: NEGATIVE
Staphylococcus aureus: NEGATIVE

## 2022-09-27 ENCOUNTER — Encounter (HOSPITAL_COMMUNITY): Payer: Self-pay | Admitting: Orthopedic Surgery

## 2022-09-28 ENCOUNTER — Ambulatory Visit (HOSPITAL_COMMUNITY): Payer: Managed Care, Other (non HMO)

## 2022-09-28 ENCOUNTER — Ambulatory Visit (HOSPITAL_BASED_OUTPATIENT_CLINIC_OR_DEPARTMENT_OTHER): Payer: Managed Care, Other (non HMO) | Admitting: Anesthesiology

## 2022-09-28 ENCOUNTER — Observation Stay (HOSPITAL_COMMUNITY)
Admission: RE | Admit: 2022-09-28 | Discharge: 2022-09-29 | Disposition: A | Discharge status: Home / Self Care | Payer: Managed Care, Other (non HMO) | Attending: Orthopedic Surgery | Admitting: Orthopedic Surgery

## 2022-09-28 ENCOUNTER — Ambulatory Visit (HOSPITAL_COMMUNITY): Payer: Managed Care, Other (non HMO) | Admitting: Anesthesiology

## 2022-09-28 ENCOUNTER — Other Ambulatory Visit: Payer: Self-pay

## 2022-09-28 ENCOUNTER — Encounter (HOSPITAL_COMMUNITY): Payer: Self-pay | Admitting: Orthopedic Surgery

## 2022-09-28 ENCOUNTER — Encounter (HOSPITAL_COMMUNITY)
Admission: RE | Disposition: A | Discharge status: Home / Self Care | Payer: Self-pay | Source: Home / Self Care | Attending: Orthopedic Surgery

## 2022-09-28 DIAGNOSIS — Z96652 Presence of left artificial knee joint: Secondary | ICD-10-CM | POA: Diagnosis not present

## 2022-09-28 DIAGNOSIS — M1611 Unilateral primary osteoarthritis, right hip: Principal | ICD-10-CM

## 2022-09-28 DIAGNOSIS — Z96641 Presence of right artificial hip joint: Secondary | ICD-10-CM

## 2022-09-28 DIAGNOSIS — Z01818 Encounter for other preprocedural examination: Secondary | ICD-10-CM

## 2022-09-28 DIAGNOSIS — I10 Essential (primary) hypertension: Secondary | ICD-10-CM | POA: Diagnosis not present

## 2022-09-28 DIAGNOSIS — Z91040 Latex allergy status: Secondary | ICD-10-CM | POA: Insufficient documentation

## 2022-09-28 DIAGNOSIS — Z87891 Personal history of nicotine dependence: Secondary | ICD-10-CM | POA: Diagnosis not present

## 2022-09-28 HISTORY — PX: TOTAL HIP ARTHROPLASTY: SHX124

## 2022-09-28 LAB — TYPE AND SCREEN
ABO/RH(D): O POS
Antibody Screen: NEGATIVE

## 2022-09-28 SURGERY — ARTHROPLASTY, HIP, TOTAL, ANTERIOR APPROACH
Anesthesia: Spinal | Site: Hip | Laterality: Right

## 2022-09-28 MED ORDER — PROPOFOL 10 MG/ML IV BOLUS
INTRAVENOUS | Status: DC | PRN
  Administered 2022-09-28: 40 mg via INTRAVENOUS

## 2022-09-28 MED ORDER — LIDOCAINE 2% (20 MG/ML) 5 ML SYRINGE
INTRAMUSCULAR | Status: DC | PRN
  Administered 2022-09-28: 40 mg via INTRAVENOUS

## 2022-09-28 MED ORDER — METHOCARBAMOL 1000 MG/10ML IJ SOLN
500.0000 mg | Freq: Four times a day (QID) | INTRAVENOUS | Status: DC | PRN
  Administered 2022-09-28: 500 mg via INTRAVENOUS
  Filled 2022-09-28: qty 500

## 2022-09-28 MED ORDER — SENNA 8.6 MG PO TABS
2.0000 | ORAL_TABLET | Freq: Every day | ORAL | Status: DC
  Administered 2022-09-28: 17.2 mg via ORAL
  Filled 2022-09-28: qty 2

## 2022-09-28 MED ORDER — MENTHOL 3 MG MT LOZG: 1.0000 | LOZENGE | OROMUCOSAL | Status: DC | PRN

## 2022-09-28 MED ORDER — DOXYLAMINE SUCCINATE (SLEEP) 25 MG PO TABS
25.0000 mg | ORAL_TABLET | Freq: Every evening | ORAL | Status: DC | PRN
  Filled 2022-09-28: qty 1

## 2022-09-28 MED ORDER — CHLORHEXIDINE GLUCONATE 0.12 % MT SOLN
15.0000 mL | Freq: Once | OROMUCOSAL | Status: AC
  Administered 2022-09-28: 15 mL via OROMUCOSAL

## 2022-09-28 MED ORDER — MORPHINE SULFATE (PF) 2 MG/ML IV SOLN: 0.5000 mg | INTRAVENOUS | Status: DC | PRN

## 2022-09-28 MED ORDER — SODIUM CHLORIDE 0.9 % IV SOLN: INTRAVENOUS | Status: DC

## 2022-09-28 MED ORDER — DEXAMETHASONE SODIUM PHOSPHATE 10 MG/ML IJ SOLN
INTRAMUSCULAR | Status: AC
  Filled 2022-09-28: qty 3

## 2022-09-28 MED ORDER — ALPRAZOLAM 0.5 MG PO TABS
0.5000 mg | ORAL_TABLET | Freq: Two times a day (BID) | ORAL | Status: DC | PRN
  Administered 2022-09-28 (×2): 0.5 mg via ORAL
  Filled 2022-09-28 (×2): qty 1

## 2022-09-28 MED ORDER — TRANEXAMIC ACID-NACL 1000-0.7 MG/100ML-% IV SOLN
1000.0000 mg | Freq: Once | INTRAVENOUS | Status: AC
  Administered 2022-09-28: 1000 mg via INTRAVENOUS
  Filled 2022-09-28: qty 100

## 2022-09-28 MED ORDER — METOCLOPRAMIDE HCL 5 MG PO TABS: 5.0000 mg | ORAL_TABLET | Freq: Three times a day (TID) | ORAL | Status: DC | PRN

## 2022-09-28 MED ORDER — LORATADINE 10 MG PO TABS
10.0000 mg | ORAL_TABLET | Freq: Every day | ORAL | Status: DC
  Administered 2022-09-29: 10 mg via ORAL
  Filled 2022-09-28: qty 1

## 2022-09-28 MED ORDER — LACTATED RINGERS IV SOLN: INTRAVENOUS | Status: DC

## 2022-09-28 MED ORDER — PROPOFOL 1000 MG/100ML IV EMUL
INTRAVENOUS | Status: AC
  Filled 2022-09-28: qty 100

## 2022-09-28 MED ORDER — FENTANYL CITRATE (PF) 100 MCG/2ML IJ SOLN
INTRAMUSCULAR | Status: DC | PRN
  Administered 2022-09-28: 100 ug via INTRAVENOUS

## 2022-09-28 MED ORDER — SCOPOLAMINE 1 MG/3DAYS TD PT72
1.0000 | MEDICATED_PATCH | TRANSDERMAL | Status: DC
  Administered 2022-09-28: 1.5 mg via TRANSDERMAL

## 2022-09-28 MED ORDER — ONDANSETRON HCL 4 MG/2ML IJ SOLN
INTRAMUSCULAR | Status: AC
  Filled 2022-09-28: qty 6

## 2022-09-28 MED ORDER — SODIUM CHLORIDE (PF) 0.9 % IJ SOLN
INTRAMUSCULAR | Status: AC
  Filled 2022-09-28: qty 50

## 2022-09-28 MED ORDER — FAMOTIDINE 20 MG PO TABS
40.0000 mg | ORAL_TABLET | Freq: Every day | ORAL | Status: DC | PRN
  Administered 2022-09-29: 40 mg via ORAL
  Filled 2022-09-28: qty 2

## 2022-09-28 MED ORDER — KETOROLAC TROMETHAMINE 30 MG/ML IJ SOLN
INTRAMUSCULAR | Status: AC
  Filled 2022-09-28: qty 1

## 2022-09-28 MED ORDER — DEXAMETHASONE SODIUM PHOSPHATE 10 MG/ML IJ SOLN
8.0000 mg | Freq: Once | INTRAMUSCULAR | Status: AC
  Administered 2022-09-28: 8 mg via INTRAVENOUS

## 2022-09-28 MED ORDER — CEFAZOLIN SODIUM-DEXTROSE 2-4 GM/100ML-% IV SOLN
2.0000 g | Freq: Four times a day (QID) | INTRAVENOUS | Status: AC
  Administered 2022-09-28 (×2): 2 g via INTRAVENOUS
  Filled 2022-09-28 (×2): qty 100

## 2022-09-28 MED ORDER — HYDROCHLOROTHIAZIDE 12.5 MG PO TABS
12.5000 mg | ORAL_TABLET | Freq: Every day | ORAL | Status: DC
  Administered 2022-09-29: 12.5 mg via ORAL
  Filled 2022-09-28: qty 1

## 2022-09-28 MED ORDER — PROPOFOL 500 MG/50ML IV EMUL
INTRAVENOUS | Status: DC | PRN
  Administered 2022-09-28: 150 ug/kg/min via INTRAVENOUS

## 2022-09-28 MED ORDER — DIPHENHYDRAMINE HCL 12.5 MG/5ML PO ELIX: 12.5000 mg | ORAL_SOLUTION | ORAL | Status: DC | PRN

## 2022-09-28 MED ORDER — SODIUM CHLORIDE (PF) 0.9 % IJ SOLN
INTRAMUSCULAR | Status: DC | PRN
  Administered 2022-09-28: 30 mL

## 2022-09-28 MED ORDER — ASPIRIN 81 MG PO CHEW
81.0000 mg | CHEWABLE_TABLET | Freq: Two times a day (BID) | ORAL | Status: DC
  Administered 2022-09-28 – 2022-09-29 (×2): 81 mg via ORAL
  Filled 2022-09-28 (×2): qty 1

## 2022-09-28 MED ORDER — FENTANYL CITRATE (PF) 100 MCG/2ML IJ SOLN
INTRAMUSCULAR | Status: AC
  Filled 2022-09-28: qty 2

## 2022-09-28 MED ORDER — POLYETHYLENE GLYCOL 3350 17 G PO PACK
17.0000 g | PACK | Freq: Two times a day (BID) | ORAL | Status: DC
  Administered 2022-09-29: 17 g via ORAL
  Filled 2022-09-28: qty 1

## 2022-09-28 MED ORDER — SCOPOLAMINE 1 MG/3DAYS TD PT72
MEDICATED_PATCH | TRANSDERMAL | Status: AC
  Filled 2022-09-28: qty 1

## 2022-09-28 MED ORDER — BUPIVACAINE IN DEXTROSE 0.75-8.25 % IT SOLN
INTRATHECAL | Status: DC | PRN
  Administered 2022-09-28: 1.8 mL via INTRATHECAL

## 2022-09-28 MED ORDER — VENLAFAXINE HCL ER 37.5 MG PO CP24
112.0000 mg | ORAL_CAPSULE | Freq: Every day | ORAL | Status: DC
  Administered 2022-09-29: 112 mg via ORAL
  Filled 2022-09-28: qty 3

## 2022-09-28 MED ORDER — BISACODYL 10 MG RE SUPP: 10.0000 mg | Freq: Every day | RECTAL | Status: DC | PRN

## 2022-09-28 MED ORDER — MIDAZOLAM HCL 2 MG/2ML IJ SOLN
INTRAMUSCULAR | Status: AC
  Filled 2022-09-28: qty 2

## 2022-09-28 MED ORDER — ONDANSETRON HCL 4 MG/2ML IJ SOLN: 4.0000 mg | Freq: Four times a day (QID) | INTRAMUSCULAR | Status: DC | PRN

## 2022-09-28 MED ORDER — HYDROCODONE-ACETAMINOPHEN 7.5-325 MG PO TABS: 1.0000 | ORAL_TABLET | ORAL | Status: DC | PRN

## 2022-09-28 MED ORDER — DEXAMETHASONE SODIUM PHOSPHATE 10 MG/ML IJ SOLN
10.0000 mg | Freq: Once | INTRAMUSCULAR | Status: AC
  Administered 2022-09-29: 10 mg via INTRAVENOUS
  Filled 2022-09-28: qty 1

## 2022-09-28 MED ORDER — KETOROLAC TROMETHAMINE 15 MG/ML IJ SOLN
7.5000 mg | Freq: Four times a day (QID) | INTRAMUSCULAR | Status: AC
  Administered 2022-09-28 – 2022-09-29 (×4): 7.5 mg via INTRAVENOUS
  Filled 2022-09-28 (×4): qty 1

## 2022-09-28 MED ORDER — ALUM & MAG HYDROXIDE-SIMETH 200-200-20 MG/5ML PO SUSP: 30.0000 mL | ORAL | Status: DC | PRN

## 2022-09-28 MED ORDER — LISINOPRIL-HYDROCHLOROTHIAZIDE 10-12.5 MG PO TABS: 1.0000 | ORAL_TABLET | Freq: Every morning | ORAL | Status: DC

## 2022-09-28 MED ORDER — SIMETHICONE 80 MG PO CHEW: 80.0000 mg | CHEWABLE_TABLET | Freq: Four times a day (QID) | ORAL | Status: DC | PRN

## 2022-09-28 MED ORDER — ORAL CARE MOUTH RINSE: 15.0000 mL | Freq: Once | OROMUCOSAL | Status: AC

## 2022-09-28 MED ORDER — METHOCARBAMOL 500 MG PO TABS: 500.0000 mg | ORAL_TABLET | Freq: Four times a day (QID) | ORAL | Status: DC | PRN

## 2022-09-28 MED ORDER — CEFAZOLIN SODIUM-DEXTROSE 2-4 GM/100ML-% IV SOLN
2.0000 g | INTRAVENOUS | Status: AC
  Administered 2022-09-28: 2 g via INTRAVENOUS
  Filled 2022-09-28: qty 100

## 2022-09-28 MED ORDER — MIDAZOLAM HCL 5 MG/5ML IJ SOLN
INTRAMUSCULAR | Status: DC | PRN
  Administered 2022-09-28: 4 mg via INTRAVENOUS

## 2022-09-28 MED ORDER — BUPIVACAINE-EPINEPHRINE (PF) 0.25% -1:200000 IJ SOLN
INTRAMUSCULAR | Status: DC | PRN
  Administered 2022-09-28: 30 mL

## 2022-09-28 MED ORDER — ACETAMINOPHEN 500 MG PO TABS
1000.0000 mg | ORAL_TABLET | Freq: Once | ORAL | Status: AC
  Administered 2022-09-28: 1000 mg via ORAL
  Filled 2022-09-28: qty 2

## 2022-09-28 MED ORDER — HYDROCODONE-ACETAMINOPHEN 5-325 MG PO TABS
1.0000 | ORAL_TABLET | ORAL | Status: DC | PRN
  Administered 2022-09-28: 1 via ORAL
  Administered 2022-09-28 – 2022-09-29 (×2): 2 via ORAL
  Administered 2022-09-29: 1 via ORAL
  Administered 2022-09-29 (×2): 2 via ORAL
  Filled 2022-09-28 (×4): qty 2
  Filled 2022-09-28: qty 1
  Filled 2022-09-28: qty 2

## 2022-09-28 MED ORDER — 0.9 % SODIUM CHLORIDE (POUR BTL) OPTIME
TOPICAL | Status: DC | PRN
  Administered 2022-09-28: 1000 mL

## 2022-09-28 MED ORDER — PROPOFOL 10 MG/ML IV BOLUS
INTRAVENOUS | Status: AC
  Filled 2022-09-28: qty 20

## 2022-09-28 MED ORDER — METOCLOPRAMIDE HCL 5 MG/ML IJ SOLN: 5.0000 mg | Freq: Three times a day (TID) | INTRAMUSCULAR | Status: DC | PRN

## 2022-09-28 MED ORDER — VENLAFAXINE HCL ER 75 MG PO CP24: 75.0000 mg | ORAL_CAPSULE | ORAL | Status: DC

## 2022-09-28 MED ORDER — POVIDONE-IODINE 10 % EX SWAB: 2.0000 | Freq: Once | CUTANEOUS | Status: DC

## 2022-09-28 MED ORDER — ACETAMINOPHEN 500 MG PO TABS
500.0000 mg | ORAL_TABLET | Freq: Four times a day (QID) | ORAL | Status: AC
  Administered 2022-09-29: 500 mg via ORAL
  Filled 2022-09-28 (×2): qty 1

## 2022-09-28 MED ORDER — PHENYLEPHRINE HCL-NACL 20-0.9 MG/250ML-% IV SOLN
INTRAVENOUS | Status: DC | PRN
  Administered 2022-09-28: 25 ug/min via INTRAVENOUS

## 2022-09-28 MED ORDER — FENTANYL CITRATE PF 50 MCG/ML IJ SOSY: 25.0000 ug | PREFILLED_SYRINGE | INTRAMUSCULAR | Status: DC | PRN

## 2022-09-28 MED ORDER — KETOROLAC TROMETHAMINE 30 MG/ML IJ SOLN
INTRAMUSCULAR | Status: DC | PRN
  Administered 2022-09-28: 30 mg via INTRA_ARTICULAR

## 2022-09-28 MED ORDER — ONDANSETRON HCL 4 MG/2ML IJ SOLN
INTRAMUSCULAR | Status: DC | PRN
  Administered 2022-09-28: 4 mg via INTRAVENOUS

## 2022-09-28 MED ORDER — ONDANSETRON HCL 4 MG PO TABS
4.0000 mg | ORAL_TABLET | Freq: Four times a day (QID) | ORAL | Status: DC | PRN
  Administered 2022-09-28 – 2022-09-29 (×2): 4 mg via ORAL
  Filled 2022-09-28 (×2): qty 1

## 2022-09-28 MED ORDER — TRANEXAMIC ACID-NACL 1000-0.7 MG/100ML-% IV SOLN
1000.0000 mg | INTRAVENOUS | Status: AC
  Administered 2022-09-28: 1000 mg via INTRAVENOUS
  Filled 2022-09-28: qty 100

## 2022-09-28 MED ORDER — LISINOPRIL 10 MG PO TABS
10.0000 mg | ORAL_TABLET | Freq: Every day | ORAL | Status: DC
  Administered 2022-09-29: 10 mg via ORAL
  Filled 2022-09-28: qty 1

## 2022-09-28 MED ORDER — BUPIVACAINE-EPINEPHRINE 0.25% -1:200000 IJ SOLN
INTRAMUSCULAR | Status: AC
  Filled 2022-09-28: qty 1

## 2022-09-28 MED ORDER — LIDOCAINE HCL (PF) 2 % IJ SOLN
INTRAMUSCULAR | Status: AC
  Filled 2022-09-28: qty 15

## 2022-09-28 MED ORDER — PHENOL 1.4 % MT LIQD: 1.0000 | OROMUCOSAL | Status: DC | PRN

## 2022-09-28 MED ORDER — STERILE WATER FOR IRRIGATION IR SOLN
Status: DC | PRN
  Administered 2022-09-28: 2000 mL

## 2022-09-28 SURGICAL SUPPLY — 44 items
ADH SKN CLS APL DERMABOND .7 (GAUZE/BANDAGES/DRESSINGS) ×1
BAG COUNTER SPONGE SURGICOUNT (BAG) IMPLANT
BAG SPEC THK2 15X12 ZIP CLS (MISCELLANEOUS)
BAG SPNG CNTER NS LX DISP (BAG)
BAG ZIPLOCK 12X15 (MISCELLANEOUS) IMPLANT
BLADE SAG 18X100X1.27 (BLADE) ×1 IMPLANT
COVER PERINEAL POST (MISCELLANEOUS) ×1 IMPLANT
COVER SURGICAL LIGHT HANDLE (MISCELLANEOUS) ×1 IMPLANT
CUP ACETBLR 52 OD PINNACLE (Hips) IMPLANT
DERMABOND ADVANCED .7 DNX12 (GAUZE/BANDAGES/DRESSINGS) ×1 IMPLANT
DRAPE FOOT SWITCH (DRAPES) ×1 IMPLANT
DRAPE STERI IOBAN 125X83 (DRAPES) ×1 IMPLANT
DRAPE U-SHAPE 47X51 STRL (DRAPES) ×2 IMPLANT
DRESSING AQUACEL AG SP 3.5X10 (GAUZE/BANDAGES/DRESSINGS) ×1 IMPLANT
DRSG AQUACEL AG ADV 3.5X10 (GAUZE/BANDAGES/DRESSINGS) IMPLANT
DRSG AQUACEL AG SP 3.5X10 (GAUZE/BANDAGES/DRESSINGS) ×1
DURAPREP 26ML APPLICATOR (WOUND CARE) ×1 IMPLANT
ELECT REM PT RETURN 15FT ADLT (MISCELLANEOUS) ×1 IMPLANT
GLOVE BIO SURGEON STRL SZ 6 (GLOVE) ×1 IMPLANT
GLOVE BIOGEL PI IND STRL 6.5 (GLOVE) ×1 IMPLANT
GLOVE BIOGEL PI IND STRL 7.5 (GLOVE) ×1 IMPLANT
GLOVE ORTHO TXT STRL SZ7.5 (GLOVE) ×2 IMPLANT
GOWN STRL REUS W/ TWL LRG LVL3 (GOWN DISPOSABLE) ×2 IMPLANT
GOWN STRL REUS W/TWL LRG LVL3 (GOWN DISPOSABLE) ×2
HEAD CERAMIC DELTA 36 PLUS 1.5 (Hips) IMPLANT
HOLDER FOLEY CATH W/STRAP (MISCELLANEOUS) ×1 IMPLANT
KIT TURNOVER KIT A (KITS) IMPLANT
LINER NEUTRAL 52X36MM PLUS 4 (Liner) IMPLANT
NDL SAFETY ECLIP 18X1.5 (MISCELLANEOUS) IMPLANT
PACK ANTERIOR HIP CUSTOM (KITS) ×1 IMPLANT
SCREW 6.5MMX35MM (Screw) IMPLANT
STEM FEMORAL SZ5 HIGH ACTIS (Stem) IMPLANT
SUT MNCRL AB 4-0 PS2 18 (SUTURE) ×1 IMPLANT
SUT STRATAFIX 0 PDS 27 VIOLET (SUTURE) ×1
SUT VIC AB 1 CT1 36 (SUTURE) ×3 IMPLANT
SUT VIC AB 2-0 CT1 27 (SUTURE) ×2
SUT VIC AB 2-0 CT1 TAPERPNT 27 (SUTURE) ×2 IMPLANT
SUTURE STRATFX 0 PDS 27 VIOLET (SUTURE) ×1 IMPLANT
SYR 3ML LL SCALE MARK (SYRINGE) IMPLANT
TRAY FOL W/BAG SLVR 16FR STRL (SET/KITS/TRAYS/PACK) IMPLANT
TRAY FOLEY MTR SLVR 16FR STAT (SET/KITS/TRAYS/PACK) IMPLANT
TRAY FOLEY W/BAG SLVR 16FR LF (SET/KITS/TRAYS/PACK) ×1
TUBE SUCTION HIGH CAP CLEAR NV (SUCTIONS) ×1 IMPLANT
WATER STERILE IRR 1000ML POUR (IV SOLUTION) ×1 IMPLANT

## 2022-09-28 NOTE — Interval H&P Note (Signed)
History and Physical Interval Note:  09/28/2022 6:44 AM  Joy Tucker  has presented today for surgery, with the diagnosis of right hip osteoarthritis.  The various methods of treatment have been discussed with the patient and family. After consideration of risks, benefits and other options for treatment, the patient has consented to  Procedure(s): TOTAL HIP ARTHROPLASTY ANTERIOR APPROACH (Right) as a surgical intervention.  The patient's history has been reviewed, patient examined, no change in status, stable for surgery.  I have reviewed the patient's chart and labs.  Questions were answered to the patient's satisfaction.     [name]

## 2022-09-28 NOTE — Evaluation (Signed)
Physical Therapy Evaluation Patient Details Name: Joy Tucker MRN: [id number] DOB: 04-23-1965 Today's Date: 09/28/2022  History of Present Illness  57 yo female presents to therapy s/p R THA, anterior approach on 09/28/2022 due to failure of conservative measures. Pt PMH includes but is not limited to: lumbar fusion and disc disorder with LBP and radiculopathy, anterior lumbar fusion L5-S1 (06/14/2022), L TKA, IBS, and GAD.  Clinical Impression     Joy Tucker is a 58 y.o. female POD 0 s/p R THA. Patient reports mod I with mobility at baseline. Patient is now limited by functional impairments (see PT problem list below) and requires min A for bed mobility and CGA and cues for transfers. Patient was able to ambulate 25 feet with RW and CGA level of assist. Patient instructed in exercise to facilitate ROM and circulation to manage edema. Patient will benefit from continued skilled PT interventions to address impairments and progress towards PLOF. Acute PT will follow to progress mobility and stair training in preparation for safe discharge home with family support and HEP.      If plan is discharge home, recommend the following: A little help with walking and/or transfers;A little help with bathing/dressing/bathroom;Assistance with cooking/housework;Help with stairs or ramp for entrance;Assist for transportation   Can travel by private vehicle        Equipment Recommendations None recommended by PT (personal RW in hospital room)  Recommendations for Other Services       Functional Status Assessment Patient has had a recent decline in their functional status and demonstrates the ability to make significant improvements in function in a reasonable and predictable amount of time.     Precautions / Restrictions Precautions Precautions: Fall Restrictions Weight Bearing Restrictions: No      Mobility  Bed Mobility Overal bed mobility: Needs Assistance Bed Mobility: Supine to Sit      Supine to sit: Min assist, HOB elevated, Used rails     General bed mobility comments: min A for R LE to EOB, increased time and cues for bending LLE to assist    Transfers Overall transfer level: Needs assistance Equipment used: Rolling walker (2 wheels) Transfers: Sit to/from Stand Sit to Stand: Contact guard assist, From elevated surface           General transfer comment: pull to stand from EOB with min cues    Ambulation/Gait Ambulation/Gait assistance: Contact guard assist Gait Distance (Feet): 25 Feet Assistive device: Rolling walker (2 wheels) Gait Pattern/deviations: Step-to pattern, Antalgic Gait velocity: decreased     General Gait Details: limited due to pain  Stairs            Wheelchair Mobility     Tilt Bed    Modified Rankin (Stroke Patients Only)       Balance Overall balance assessment: Needs assistance, History of Falls (few falls in the past 6 months 2 of which down the steps) Sitting-balance support: Feet supported Sitting balance-Leahy Scale: Good     Standing balance support: Bilateral upper extremity supported, During functional activity, Reliant on assistive device for balance Standing balance-Leahy Scale: Poor                               Pertinent Vitals/Pain Pain Assessment Pain Assessment: 0-10 Pain Score: 8  Pain Location: R LE and hip Pain Descriptors / Indicators: Aching, Dull, Discomfort, Constant, Operative site guarding Pain Intervention(s): Monitored during session, Limited activity within patient's  tolerance, Premedicated before session, Repositioned, Ice applied    Home Living Family/patient expects to be discharged to:: Private residence Living Arrangements: Alone (dughter when home and son will assist s/p d/c) Available Help at Discharge: Family Type of Home: House Home Access: Stairs to enter Entrance Stairs-Rails: Right Entrance Stairs-Number of Steps: 2 (from garage)   Home Layout: Two  level;Able to live on main level with bedroom/bathroom Home Equipment: Rolling Walker (2 wheels);Cane - single point;BSC/3in1;Shower seat      Prior Function Prior Level of Function : Independent/Modified Independent;Driving             Mobility Comments: use of RW over the past 2 wk, mod I for ADLs and self care tasks       Extremity/Trunk Assessment        Lower Extremity Assessment Lower Extremity Assessment: RLE deficits/detail RLE Deficits / Details: ankle PF 5/5, DF 4/5 RLE Sensation: history of peripheral neuropathy;decreased light touch    Cervical / Trunk Assessment Cervical / Trunk Assessment: Back Surgery  Communication   Communication Communication: No apparent difficulties  Cognition Arousal: Alert (sleeping when PT arrived) Behavior During Therapy: WFL for tasks assessed/performed Overall Cognitive Status: Within Functional Limits for tasks assessed                                          General Comments      Exercises Total Joint Exercises Ankle Circles/Pumps: AROM, Both, 15 reps   Assessment/Plan    PT Assessment Patient needs continued PT services  PT Problem List Decreased strength;Decreased range of motion;Decreased activity tolerance;Decreased mobility;Decreased balance;Decreased coordination;Pain       PT Treatment Interventions DME instruction;Gait training;Stair training;Functional mobility training;Therapeutic activities;Therapeutic exercise;Balance training;Neuromuscular re-education;Patient/family education;Modalities    PT Goals (Current goals can be found in the Care Plan section)  Acute Rehab PT Goals Patient Stated Goal: to be more active and loose 15# PT Goal Formulation: With patient Time For Goal Achievement: 10/12/22 Potential to Achieve Goals: Good    Frequency 7X/week     Co-evaluation               AM-PAC PT "6 Clicks" Mobility  Outcome Measure Help needed turning from your back to your  side while in a flat bed without using bedrails?: A Little Help needed moving from lying on your back to sitting on the side of a flat bed without using bedrails?: A Little Help needed moving to and from a bed to a chair (including a wheelchair)?: A Little Help needed standing up from a chair using your arms (e.g., wheelchair or bedside chair)?: A Little Help needed to walk in hospital room?: A Little Help needed climbing 3-5 steps with a railing? : A Lot 6 Click Score: 17    End of Session Equipment Utilized During Treatment: Gait belt Activity Tolerance: Patient limited by pain Patient left: in chair;with call bell/phone within reach;with chair alarm set;with family/visitor present Nurse Communication: Mobility status PT Visit Diagnosis: Unsteadiness on feet (R26.81);Other abnormalities of gait and mobility (R26.89);Muscle weakness (generalized) (M62.81);History of falling (Z91.81);Difficulty in walking, not elsewhere classified (R26.2);Pain Pain - Right/Left: Right Pain - part of body: Hip;Leg    Time: 4401-0272 PT Time Calculation (min) (ACUTE ONLY): 24 min   Charges:   PT Evaluation $PT Eval Low Complexity: 1 Low PT Treatments $Therapeutic Activity: 8-22 mins PT General Charges $$ ACUTE PT VISIT:  1 Visit         Johnny Bridge, PT Acute Rehab   Jacqualyn Posey 09/28/2022, 4:31 PM

## 2022-09-28 NOTE — Discharge Instructions (Signed)

## 2022-09-28 NOTE — Anesthesia Preprocedure Evaluation (Addendum)
Anesthesia Evaluation  Patient identified by MRN, date of birth, ID band Patient awake    Reviewed: Allergy & Precautions, NPO status , Patient's Chart, lab work & pertinent test results  History of Anesthesia Complications (+) PONV and history of anesthetic complications  Airway Mallampati: III  TM Distance: >3 FB Neck ROM: Full  Mouth opening: Limited Mouth Opening  Dental no notable dental hx. (+) Teeth Intact, Dental Advisory Given   Pulmonary Patient abstained from smoking., former smoker   Pulmonary exam normal breath sounds clear to auscultation       Cardiovascular hypertension, Pt. on medications Normal cardiovascular exam Rhythm:Regular Rate:Normal     Neuro/Psych  PSYCHIATRIC DISORDERS Anxiety Depression    negative neurological ROS     GI/Hepatic Neg liver ROS,GERD  ,,  Endo/Other  negative endocrine ROS    Renal/GU negative Renal ROS  negative genitourinary   Musculoskeletal  (+) Arthritis ,    Abdominal   Peds  Hematology negative hematology ROS (+)   Anesthesia Other Findings   Reproductive/Obstetrics                             Anesthesia Physical Anesthesia Plan  ASA: 2  Anesthesia Plan: Spinal   Post-op Pain Management: Tylenol PO (pre-op)*   Induction:   PONV Risk Score and Plan: Treatment may vary due to age or medical condition, Midazolam, Ondansetron, Dexamethasone and Propofol infusion  Airway Management Planned: Natural Airway  Additional Equipment:   Intra-op Plan:   Post-operative Plan:   Informed Consent: I have reviewed the patients History and Physical, chart, labs and discussed the procedure including the risks, benefits and alternatives for the proposed anesthesia with the patient or authorized representative who has indicated his/her understanding and acceptance.     Dental advisory given  Plan Discussed with: CRNA  Anesthesia Plan  Comments:        Anesthesia Quick Evaluation

## 2022-09-28 NOTE — Anesthesia Procedure Notes (Addendum)
Spinal  Patient location during procedure: OR Start time: 09/28/2022 8:30 AM End time: 09/28/2022 8:33 AM Reason for block: surgical anesthesia Staffing Performed: anesthesiologist  Anesthesiologist: Elmer Picker, MD Performed by: Elmer Picker, MD Authorized by: Elmer Picker, MD   Preanesthetic Checklist Completed: patient identified, IV checked, risks and benefits discussed, surgical consent, monitors and equipment checked, pre-op evaluation and timeout performed Spinal Block Patient position: sitting Prep: DuraPrep and site prepped and draped Patient monitoring: cardiac monitor, continuous pulse ox and blood pressure Approach: midline Location: L3-4 Injection technique: single-shot Needle Needle type: Pencan  Needle gauge: 24 G Needle length: 9 cm Assessment Sensory level: T6 Events: CSF return Additional Notes Functioning IV was confirmed and monitors were applied. Sterile prep and drape, including hand hygiene and sterile gloves were used. The patient was positioned and the spine was prepped. The skin was anesthetized with lidocaine.  Free flow of clear CSF was obtained prior to injecting local anesthetic into the CSF.  The spinal needle aspirated freely following injection.  The needle was carefully withdrawn.  The patient tolerated the procedure well.

## 2022-09-28 NOTE — Anesthesia Postprocedure Evaluation (Signed)
Anesthesia Post Note  Patient: Joy Tucker  Procedure(s) Performed: TOTAL HIP ARTHROPLASTY ANTERIOR APPROACH (Right: Hip)     Patient location during evaluation: PACU Anesthesia Type: Spinal Level of consciousness: oriented and awake and alert Pain management: pain level controlled Vital Signs Assessment: post-procedure vital signs reviewed and stable Respiratory status: spontaneous breathing, respiratory function stable and patient connected to nasal cannula oxygen Cardiovascular status: blood pressure returned to baseline and stable Postop Assessment: no headache, no backache and no apparent nausea or vomiting Anesthetic complications: no  No notable events documented.  Last Vitals:  Vitals:   09/28/22 1145 09/28/22 1226  BP: 104/83 109/83  Pulse: 80 82  Resp: 16 18  Temp:  36.8 C  SpO2: 98% 100%    Last Pain:  Vitals:   09/28/22 1155  TempSrc:   PainSc: 0-No pain                 [name] L Norton Bivins

## 2022-09-28 NOTE — Op Note (Signed)
NAME:  Joy Tucker NO.: [id number]      MEDICAL RECORD NO.: [id number]      FACILITY:  [facility]      PHYSICIAN:  [name]  DATE OF BIRTH:  01-26-1966     DATE OF PROCEDURE:  09/28/2022                                 OPERATIVE REPORT         PREOPERATIVE DIAGNOSIS: Right  hip osteoarthritis.      POSTOPERATIVE DIAGNOSIS:  Right hip osteoarthritis.      PROCEDURE:  Right total hip replacement through an anterior approach   utilizing DePuy THR system, component size 52 mm pinnacle cup, a size 36+4 neutral   Altrex liner, a size 5 Hi Actis stem with a 36+1.5 delta ceramic   ball.      SURGEON:  Madlyn Frankel. Charlann Boxer, M.D.      ASSISTANT:  Rosalene Billings, PA-C     ANESTHESIA:  Spinal.      SPECIMENS:  None.      COMPLICATIONS:  None.      BLOOD LOSS:  300 cc     DRAINS:  None.      INDICATION OF THE PROCEDURE:  Joy Tucker is a 57 y.o. female who had   presented to office for evaluation of right hip pain.  Radiographs revealed   progressive degenerative changes with bone-on-bone   articulation of the  hip joint, including subchondral cystic changes and osteophytes.  The patient had painful limited range of   motion significantly affecting their overall quality of life and function.  The patient was failing to    respond to conservative measures including medications and/or injections and activity modification and at this point was ready   to proceed with more definitive measures.  Consent was obtained for   benefit of pain relief.  Specific risks of infection, DVT, component   failure, dislocation, neurovascular injury, and need for revision surgery were reviewed in the office.     PROCEDURE IN DETAIL:  The patient was brought to operative theater.   Once adequate anesthesia, preoperative antibiotics, 2 gm of Ancef, 1 gm of Tranexamic Acid, and 10 mg of Decadron were administered, the patient was positioned supine on the  [employer] table.  Once the patient was safely positioned with adequate padding of boney prominences we predraped out the hip, and used fluoroscopy to confirm orientation of the pelvis.      The right hip was then prepped and draped from proximal iliac crest to   mid thigh with a shower curtain technique.      Time-out was performed identifying the patient, planned procedure, and the appropriate extremity.     An incision was then made 2 cm lateral to the   anterior superior iliac spine extending over the orientation of the   tensor fascia lata muscle and sharp dissection was carried down to the   fascia of the muscle.      The fascia was then incised.  The muscle belly was identified and swept   laterally and retractor placed along the superior neck.  Following   cauterization of the circumflex vessels and removing some pericapsular   fat, a second cobra retractor was placed on the inferior  neck.  A T-capsulotomy was made along the line of the   superior neck to the trochanteric fossa, then extended proximally and   distally.  Tag sutures were placed and the retractors were then placed   intracapsular.  We then identified the trochanteric fossa and   orientation of my neck cut and then made a neck osteotomy with the femur on traction.  The femoral   head was removed without difficulty or complication.  Traction was let   off and retractors were placed posterior and anterior around the   acetabulum.      The labrum and foveal tissue were debrided.  I began reaming with a 45 mm   reamer and reamed up to 51 mm reamer with good bony bed preparation and a 52 mm  cup was chosen.  The final 52 mm Pinnacle cup was then impacted under fluoroscopy to confirm the depth of penetration and orientation with respect to   Abduction and forward flexion.  A screw was placed into the ilium followed by the hole eliminator.  The final   36+4 neutral Altrex liner was impacted with good visualized rim fit.  The  cup was positioned anatomically within the acetabular portion of the pelvis.      At this point, the femur was rolled to 100 degrees.  Further capsule was   released off the inferior aspect of the femoral neck.  I then   released the superior capsule proximally.  With the leg in a neutral position the hook was placed laterally   along the femur under the vastus lateralis origin and elevated manually and then held in position using the hook attachment on the bed.  The leg was then extended and adducted with the leg rolled to 100   degrees of external rotation.  Retractors were placed along the medial calcar and posteriorly over the greater trochanter.  Once the proximal femur was fully   exposed, I used a box osteotome to set orientation.  I then began   broaching with the starting chili pepper broach and passed this by hand and then broached up to 5.  With the 5 broach in place I chose a high offset neck and did several trial reductions.  The offset was appropriate, leg lengths   appeared to be equal best matched with the +1.5 head ball trial confirmed radiographically.   Given these findings, I went ahead and dislocated the hip, repositioned all   retractors and positioned the right hip in the extended and abducted position.  The final 5 Hi Actis stem was   chosen and it was impacted down to the level of neck cut.  Based on this   and the trial reductions, a final 36+1.5 delta ceramic ball was chosen and   impacted onto a clean and dry trunnion, and the hip was reduced.  The   hip had been irrigated throughout the case again at this point.  I did   reapproximate the superior capsular leaflet to the anterior leaflet   using #1 Vicryl.  The fascia of the   tensor fascia lata muscle was then reapproximated using #1 Vicryl and #0 Stratafix sutures.  The   remaining wound was closed with 2-0 Vicryl and running 4-0 Monocryl.   The hip was cleaned, dried, and dressed sterilely using Dermabond and    Aquacel dressing.  The patient was then brought   to recovery room in stable condition tolerating the procedure well.    Morrie Sheldon  Domenic Schwab, PA-C was present for the entirety of the case involved from   preoperative positioning, perioperative retractor management, general   facilitation of the case, as well as primary wound closure as assistant.            Madlyn Frankel Charlann Boxer, M.D.        09/28/2022 8:35 AM

## 2022-09-28 NOTE — Transfer of Care (Signed)
Immediate Anesthesia Transfer of Care Note  Patient: Joy Tucker  Procedure(s) Performed: TOTAL HIP ARTHROPLASTY ANTERIOR APPROACH (Right: Hip)  Patient Location: PACU  Anesthesia Type:Spinal  Level of Consciousness: awake, alert , and oriented  Airway & Oxygen Therapy: Patient Spontanous Breathing  Post-op Assessment: Report given to RN  Post vital signs: Reviewed  Last Vitals:  Vitals Value Taken Time  BP 75/48 09/28/22 1001  Temp    Pulse 66 09/28/22 1002  Resp 12 09/28/22 1002  SpO2 100 % 09/28/22 1002  Vitals shown include unfiled device data.  Last Pain:  Vitals:   09/28/22 0649  TempSrc:   PainSc: 7       Patients Stated Pain Goal: 4 (09/28/22 3474)  Complications: No notable events documented.

## 2022-09-28 NOTE — H&P (Signed)
TOTAL HIP ADMISSION H&P  Patient is admitted for right total hip arthroplasty.  Therapy Plans: HEP Disposition: Home with son initially Planned DVT Prophylaxis: aspirin 81mg  BID DME needed: none PCP: TBD, Dr. Wynelle Link / Yetta Barre (saw last week - will fill out form) -- clearance received TXA: IV Allergies: latex - itching, phenergan - hypotension/hallucinations Anesthesia Concerns: wants scope patch , VOMITING BMI: 25.4 Last HgbA1c: Not diabetic   Other: - ALIF May 20 with Dr. Shon Baton (may want PT at 6 weeks for both had to d/c back PT due to hip) - Does not tolerate opioids well -- N/V, dizziness, etc > scope patch - hydrocodone, tylenol, methocarbamol, celebrex - No hx of VTE or cancer   Subjective:  Chief Complaint: right hip pain  HPI: Joy Tucker, 57 y.o. female, has a history of pain and functional disability in the right hip(s) due to arthritis and patient has failed non-surgical conservative treatments for greater than 12 weeks to include NSAID's and/or analgesics and activity modification.  Onset of symptoms was gradual starting 1 years ago with gradually worsening course since that time.The patient noted no past surgery on the right hip(s).  Patient currently rates pain in the right hip at 8 out of 10 with activity. Patient has worsening of pain with activity and weight bearing and pain that interfers with activities of daily living. Patient has evidence of joint space narrowing by imaging studies. This condition presents safety issues increasing the risk of falls.   There is no current active infection.  Patient Active Problem List   Diagnosis Date Noted   S/P lumbar fusion 06/14/2022   Hypertensive disorder 05/24/2022   Degeneration of lumbar intervertebral disc 10/21/2021   Low back pain 09/02/2021   Traumatic rupture of lumbar intervertebral disc 07/16/2021   Lumbar radiculopathy 09/05/2020   Obsessive-compulsive disorder 02/06/2018   S/P total knee replacement  06/06/2017   History of total knee arthroplasty 06/06/2017   Exposure to influenza 05/23/2017   Osteoarthritis of left knee 04/08/2017   Pain in left knee 04/08/2017   Lump of breast, right 01/15/2016   ANXIETY 10/29/2008   DEPRESSION 10/29/2008   Irritable bowel syndrome 10/29/2008   Anxiety state 10/29/2008   Depressive disorder 10/29/2008   Past Medical History:  Diagnosis Date   Anxiety    Arthritis    Complication of anesthesia    Depression    Family history of adverse reaction to anesthesia    nausea and vomiting   GERD (gastroesophageal reflux disease)    Heart murmur    as child, grew out of   Hypertension    IBS (irritable bowel syndrome)    IBS (irritable bowel syndrome)    Pneumonia    walking   PONV (postoperative nausea and vomiting)     Past Surgical History:  Procedure Laterality Date   ABDOMINAL EXPOSURE N/A 06/14/2022   Procedure: ABDOMINAL EXPOSURE FOR ANTERIOR LUMBAR SPINE SURGERY;  Surgeon: Cephus Shelling, MD;  Location: [facility] OR;  Service: Vascular;  Laterality: N/A;   ANTERIOR LUMBAR FUSION N/A 06/14/2022   Procedure: ANTERIOR LUMBAR FUSION  LUMBAR FIVE TO SACRAL ONE;  Surgeon: Venita Lick, MD;  Location: [facility] OR;  Service: Orthopedics;  Laterality: N/A;  4 hrs Dr. Chestine Spore to do approach 3 C-Bed left tap block with exparel   BREAST SURGERY     x2 -left biopsy-benign   CESAREAN SECTION  9528,4132   DILATION AND CURETTAGE OF UTERUS     meniscus knee repair  TOTAL KNEE ARTHROPLASTY Left 06/06/2017   Procedure: LEFT TOTAL KNEE ARTHROPLASTY;  Surgeon: Durene Romans, MD;  Location: [facility] ORS;  Service: Orthopedics;  Laterality: Left;    Current Facility-Administered Medications  Medication Dose Route Frequency Provider Last Rate Last Admin   ceFAZolin (ANCEF) IVPB 2g/100 mL premix  2 g Intravenous On Call to OR Cassandria Anger, PA-C       chlorhexidine (PERIDEX) 0.12 % solution 15 mL  15 mL Mouth/Throat Once Marcene Duos, MD       Or   Oral  care mouth rinse  15 mL Mouth Rinse Once Marcene Duos, MD       dexamethasone (DECADRON) injection 8 mg  8 mg Intravenous Once Cassandria Anger, PA-C       lactated ringers infusion   Intravenous Continuous Cassandria Anger, PA-C       lactated ringers infusion   Intravenous Continuous Marcene Duos, MD       povidone-iodine 10 % swab 2 Application  2 Application Topical Once Cassandria Anger, PA-C       tranexamic acid (CYKLOKAPRON) IVPB 1,000 mg  1,000 mg Intravenous To OR Cassandria Anger, PA-C       Allergies  Allergen Reactions   Mushroom Extract Complex Anaphylaxis   Other Anaphylaxis    Mushrooms    Promethazine Hcl Other (See Comments)    Hallucinations Heart palpitations flailing of arms and legs   Latex Itching    Turns skin red    Social History   Tobacco Use   Smoking status: Former    Current packs/day: 0.00    Types: Cigarettes    Quit date: 05/18/2017    Years since quitting: 5.3   Smokeless tobacco: Never  Substance Use Topics   Alcohol use: Yes    Alcohol/week: 10.0 standard drinks of alcohol    Types: 10 Glasses of wine per week    History reviewed. No pertinent family history.   Review of Systems  Constitutional:  Negative for chills and fever.  Respiratory:  Negative for cough and shortness of breath.   Cardiovascular:  Negative for chest pain.  Gastrointestinal:  Negative for nausea and vomiting.  Musculoskeletal:  Positive for arthralgias.     Objective:  Physical Exam Well nourished and well developed. General: Alert and oriented x3, cooperative and pleasant, no acute distress. Head: normocephalic, atraumatic, neck supple. Eyes: EOMI.  Musculoskeletal: Right hip exam: Based on her radiographic appearance of her right hip I limited my examination of her right hip to prevent pain. She does have a slight external rotation contracture noted with active hip flexion with 5 -/5 strength due to pain No lower extremity edema,  erythema or calf tenderness  Calves soft and nontender. Motor function intact in LE. Strength 5/5 LE bilaterally. Neuro: Distal pulses 2+. Sensation to light touch intact in LE.  Vital signs in last 24 hours: Temp:  [98.2 F (36.8 C)] 98.2 F (36.8 C) (09/03 1610) Pulse Rate:  [84] 84 (09/03 0632) Resp:  [18] 18 (09/03 9604) BP: (119)/(78) 119/78 (09/03 5409) SpO2:  [97 %] 97 % (09/03 8119)  Labs:   Estimated body mass index is 25.49 kg/m as calculated from the following:   Height as of 09/22/22: 5' 4.75" (1.645 m).   Weight as of 09/22/22: 68.9 kg.   Imaging Review Plain radiographs demonstrate severe degenerative joint disease of the right hip(s). The bone quality appears to be adequate for age and reported activity level.  Assessment/Plan:  End stage arthritis, right hip(s)  The patient history, physical examination, clinical judgement of the provider and imaging studies are consistent with end stage degenerative joint disease of the right hip(s) and total hip arthroplasty is deemed medically necessary. The treatment options including medical management, injection therapy, arthroscopy and arthroplasty were discussed at length. The risks and benefits of total hip arthroplasty were presented and reviewed. The risks due to aseptic loosening, infection, stiffness, dislocation/subluxation,  thromboembolic complications and other imponderables were discussed.  The patient acknowledged the explanation, agreed to proceed with the plan and consent was signed. Patient is being admitted for inpatient treatment for surgery, pain control, PT, OT, prophylactic antibiotics, VTE prophylaxis, progressive ambulation and ADL's and discharge planning.The patient is planning to be discharged  home.   Rosalene Billings, PA-C Orthopedic Surgery [facility] [place] [phone]

## 2022-09-28 NOTE — Plan of Care (Signed)
Problem: Education: Goal: Knowledge of the prescribed therapeutic regimen will improve Outcome: Progressing   Problem: Activity: Goal: Ability to avoid complications of mobility impairment will improve Outcome: Progressing   Problem: Clinical Measurements: Goal: Postoperative complications will be avoided or minimized Outcome: Progressing   Problem: Pain Management: Goal: Pain level will decrease with appropriate interventions Outcome: Progressing   Haydee Salter, RN 09/28/22 4:35 PM

## 2022-09-29 ENCOUNTER — Encounter (HOSPITAL_COMMUNITY): Payer: Self-pay | Admitting: Orthopedic Surgery

## 2022-09-29 DIAGNOSIS — M1611 Unilateral primary osteoarthritis, right hip: Secondary | ICD-10-CM | POA: Diagnosis not present

## 2022-09-29 LAB — BASIC METABOLIC PANEL
Anion gap: 7 (ref 5–15)
BUN: 10 mg/dL (ref 6–20)
CO2: 28 mmol/L (ref 22–32)
Calcium: 8.3 mg/dL — ABNORMAL LOW (ref 8.9–10.3)
Chloride: 99 mmol/L (ref 98–111)
Creatinine, Ser: 0.59 mg/dL (ref 0.44–1.00)
GFR, Estimated: >60 mL/min (ref >60)
Glucose, Bld: 169 mg/dL — ABNORMAL HIGH (ref 70–99)
Potassium: 4.3 mmol/L (ref 3.5–5.1)
Sodium: 134 mmol/L — ABNORMAL LOW (ref 135–145)

## 2022-09-29 LAB — CBC
HCT: 25.9 % — ABNORMAL LOW (ref 36.0–46.0)
Hemoglobin: 8.5 g/dL — ABNORMAL LOW (ref 12.0–15.0)
MCH: 33.2 pg (ref 26.0–34.0)
MCHC: 32.8 g/dL (ref 30.0–36.0)
MCV: 101.2 fL — ABNORMAL HIGH (ref 80.0–100.0)
Platelets: 161 10*3/uL (ref 150–400)
RBC: 2.56 MIL/uL — ABNORMAL LOW (ref 3.87–5.11)
RDW: 12.1 % (ref 11.5–15.5)
WBC: 9.2 10*3/uL (ref 4.0–10.5)
nRBC: 0 % (ref 0.0–0.2)

## 2022-09-29 MED ORDER — ASPIRIN 81 MG PO CHEW: 81.0000 mg | CHEWABLE_TABLET | Freq: Two times a day (BID) | ORAL | 0 refills | Status: AC

## 2022-09-29 MED ORDER — HYDROCODONE-ACETAMINOPHEN 5-325 MG PO TABS: 1.0000 | ORAL_TABLET | ORAL | 0 refills | Status: AC | PRN

## 2022-09-29 MED ORDER — METHOCARBAMOL 500 MG PO TABS: 500.0000 mg | ORAL_TABLET | Freq: Four times a day (QID) | ORAL | 2 refills | Status: AC | PRN

## 2022-09-29 MED ORDER — POLYETHYLENE GLYCOL 3350 17 G PO PACK: 17.0000 g | PACK | Freq: Two times a day (BID) | ORAL | 0 refills | Status: AC

## 2022-09-29 MED ORDER — SENNA 8.6 MG PO TABS: 2.0000 | ORAL_TABLET | Freq: Every day | ORAL | 0 refills | Status: AC

## 2022-09-29 MED ORDER — CELECOXIB 200 MG PO CAPS: 200.0000 mg | ORAL_CAPSULE | Freq: Two times a day (BID) | ORAL | 0 refills | Status: AC

## 2022-09-29 NOTE — TOC Transition Note (Signed)
Transition of Care [facility]) - CM/SW Discharge Note  Patient Details  Name: Joy Tucker MRN: [id number] Date of Birth: [birth year]  [facility] CM/SW Contact:  Ewing Schlein, LCSW Phone Number: 09/29/2022, 9:34 AM  Clinical Narrative: Patient is expected to discharge home after working with PT. CSW met with patient to confirm discharge plan. Patient will go home with a home exercise program (HEP). Patient has a rolling walker at home, so there are no DME needs at this time. TOC signing off.    Final next level of care: Home/Self Care Barriers to Discharge: No Barriers Identified  Patient Goals and CMS Choice Choice offered to / list presented to : NA  Discharge Plan and Services Additional resources added to the After Visit Summary for          DME Arranged: N/A DME Agency: NA  Social Determinants of Health (SDOH) Interventions SDOH Screenings   Food Insecurity: No Food Insecurity (09/28/2022)  Housing: Low Risk  (09/28/2022)  Transportation Needs: No Transportation Needs (09/28/2022)  Utilities: Not At Risk (09/28/2022)  Tobacco Use: Medium Risk (09/28/2022)   Readmission Risk Interventions     No data to display

## 2022-09-29 NOTE — Progress Notes (Signed)
Provided discharge education/instructions, all questions answered. Pt is not in any distress. Pt to discharge home with all of her belongings accompanied by her son.

## 2022-09-29 NOTE — Progress Notes (Signed)
Physical Therapy Treatment Patient Details Name: Joy Tucker MRN: [id number] DOB: February 23, 1965 Today's Date: 09/29/2022   History of Present Illness 57 yo female presents to therapy s/p R THA, anterior approach on 09/28/2022 due to failure of conservative measures. Pt PMH includes but is not limited to: lumbar fusion and disc disorder with LBP and radiculopathy, anterior lumbar fusion L5-S1 (06/14/2022), L TKA, IBS, and GAD.    PT Comments  POD # 1  Assisted with amb in hallway.  Practiced stairs. Reviewed HEP.  Addressed all mobility questions, discussed appropriate activity, educated on use of ICE.  Pt ready for D/C to home.    If plan is discharge home, recommend the following: A little help with walking and/or transfers;A little help with bathing/dressing/bathroom;Assistance with cooking/housework;Help with stairs or ramp for entrance;Assist for transportation   Can travel by private vehicle        Equipment Recommendations  None recommended by PT    Recommendations for Other Services       Precautions / Restrictions Precautions Precautions: Fall Restrictions Weight Bearing Restrictions: No RLE Weight Bearing: Weight bearing as tolerated     Mobility  Bed Mobility               General bed mobility comments: pt OOB in recliner    Transfers Overall transfer level: Needs assistance Equipment used: Rolling walker (2 wheels) Transfers: Sit to/from Stand Sit to Stand: Contact guard assist, From elevated surface           General transfer comment: vc's on proper hand placement and safety with turns    Ambulation/Gait Ambulation/Gait assistance: Supervision Gait Distance (Feet): 55 Feet Assistive device: Rolling walker (2 wheels) Gait Pattern/deviations: Step-to pattern, Antalgic Gait velocity: decreased     General Gait Details: tolerated a functional distance   Stairs Stairs: Yes Stairs assistance: Contact guard assist, Min assist Stair Management:  One rail Right, One rail Left Number of Stairs: 4 General stair comments: vc's on proper tech and sequencing, up/down using one rail both hands   Wheelchair Mobility     Tilt Bed    Modified Rankin (Stroke Patients Only)       Balance                                            Cognition   Behavior During Therapy: WFL for tasks assessed/performed Overall Cognitive Status: Within Functional Limits for tasks assessed                                 General Comments: axo x 3        Exercises      General Comments        Pertinent Vitals/Pain Pain Assessment Pain Assessment: 0-10 Pain Score: 5  Pain Location: R LE and hip Pain Descriptors / Indicators: Aching, Dull, Discomfort, Constant, Operative site guarding Pain Intervention(s): Monitored during session, Premedicated before session, Repositioned, Ice applied    Home Living                          Prior Function            PT Goals (current goals can now be found in the care plan section) Progress towards PT goals: Progressing toward goals  Frequency    7X/week      PT Plan      Co-evaluation              AM-PAC PT "6 Clicks" Mobility   Outcome Measure  Help needed turning from your back to your side while in a flat bed without using bedrails?: A Little Help needed moving from lying on your back to sitting on the side of a flat bed without using bedrails?: A Little Help needed moving to and from a bed to a chair (including a wheelchair)?: A Little Help needed standing up from a chair using your arms (e.g., wheelchair or bedside chair)?: A Little Help needed to walk in hospital room?: A Little Help needed climbing 3-5 steps with a railing? : A Little 6 Click Score: 18    End of Session Equipment Utilized During Treatment: Gait belt Activity Tolerance: Patient tolerated treatment well Patient left: in chair;with call bell/phone within  reach;with chair alarm set;with family/visitor present Nurse Communication: Mobility status PT Visit Diagnosis: Unsteadiness on feet (R26.81);Other abnormalities of gait and mobility (R26.89);Muscle weakness (generalized) (M62.81);History of falling (Z91.81);Difficulty in walking, not elsewhere classified (R26.2);Pain Pain - Right/Left: Right Pain - part of body: Hip;Leg     Time: 2130-8657 PT Time Calculation (min) (ACUTE ONLY): 28 min  Charges:    $Gait Training: 8-22 mins $Therapeutic Activity: 8-22 mins PT General Charges $$ ACUTE PT VISIT: 1 Visit                     Felecia Shelling  PTA Acute  Rehabilitation Services Office M-F          [phone]

## 2022-09-29 NOTE — Plan of Care (Signed)
  Problem: Pain Management: Goal: Pain level will decrease with appropriate interventions Outcome: Progressing   Problem: Coping: Goal: Level of anxiety will decrease Outcome: Progressing   

## 2022-09-29 NOTE — Progress Notes (Signed)
   Subjective: 1 Day Post-Op Procedure(s) (LRB): TOTAL HIP ARTHROPLASTY ANTERIOR APPROACH (Right) Patient reports pain as mild.   Patient seen in rounds by Dr. Charlann Boxer. Patient is resting in bed on exam this morning. No acute events overnight. Foley catheter removed. Patient ambulated 25 feet with PT yesterday. We will start therapy today.   Objective: Vital signs in last 24 hours: Temp:  [96.8 F (36 C)-98.3 F (36.8 C)] 98.3 F (36.8 C) (09/04 0533) Pulse Rate:  [58-93] 93 (09/04 0533) Resp:  [10-19] 17 (09/04 0533) BP: (75-127)/(48-90) 102/72 (09/04 0533) SpO2:  [97 %-100 %] 100 % (09/04 0533)  Intake/Output from previous day:  Intake/Output Summary (Last 24 hours) at 09/29/2022 0848 Last data filed at 09/29/2022 0533 Gross per 24 hour  Intake 4415.89 ml  Output 5600 ml  Net -1184.11 ml     Intake/Output this shift: No intake/output data recorded.  Labs: Recent Labs    09/29/22 0339  HGB 8.5*   Recent Labs    09/29/22 0339  WBC 9.2  RBC 2.56*  HCT 25.9*  PLT 161   Recent Labs    09/29/22 0339  NA 134*  K 4.3  CL 99  CO2 28  BUN 10  CREATININE 0.59  GLUCOSE 169*  CALCIUM 8.3*   No results for input(s): "LABPT", "INR" in the last 72 hours.  Exam: General - Patient is Alert and Oriented Extremity - Neurologically intact Sensation intact distally Intact pulses distally Dorsiflexion/Plantar flexion intact Dressing - dressing C/D/I Motor Function - intact, moving foot and toes well on exam.   Past Medical History:  Diagnosis Date   Anxiety    Arthritis    Complication of anesthesia    Depression    Family history of adverse reaction to anesthesia    nausea and vomiting   GERD (gastroesophageal reflux disease)    Heart murmur    as child, grew out of   Hypertension    IBS (irritable bowel syndrome)    IBS (irritable bowel syndrome)    Pneumonia    walking   PONV (postoperative nausea and vomiting)     Assessment/Plan: 1 Day Post-Op  Procedure(s) (LRB): TOTAL HIP ARTHROPLASTY ANTERIOR APPROACH (Right) Principal Problem:   S/P total right hip arthroplasty  Estimated body mass index is 25.49 kg/m as calculated from the following:   Height as of this encounter: 5' 4.75" (1.645 m).   Weight as of this encounter: 68.9 kg. Advance diet Up with therapy D/C IV fluids  DVT Prophylaxis - Aspirin Weight bearing as tolerated.  Hgb stable at 8.5 this AM.  Plan is to go Home after hospital stay. Plan for discharge today after meeting goals with therapy. Follow up in the office in 2 weeks.   Rosalene Billings, PA-C Orthopedic Surgery [phone] 09/29/2022, 8:48 AM

## 2022-09-29 NOTE — Plan of Care (Signed)
  Problem: Education: Goal: Knowledge of the prescribed therapeutic regimen will improve Outcome: Progressing Goal: Understanding of discharge needs will improve Outcome: Progressing Goal: Individualized Educational Video(s) Outcome: Progressing   Problem: Activity: Goal: Ability to avoid complications of mobility impairment will improve Outcome: Progressing Goal: Ability to tolerate increased activity will improve Outcome: Progressing   Problem: Clinical Measurements: Goal: Postoperative complications will be avoided or minimized Outcome: Progressing   Problem: Pain Management: Goal: Pain level will decrease with appropriate interventions Outcome: Progressing   

## 2022-10-11 NOTE — Discharge Summary (Signed)
Patient ID: Joy Tucker MRN: [id number] DOB/AGE: 07/31/1965 57 y.o.  Admit date: 09/28/2022 Discharge date: 09/29/2022  Admission Diagnoses:  Right hip osteoarthritis  Discharge Diagnoses:  Principal Problem:   S/P total right hip arthroplasty   Past Medical History:  Diagnosis Date   Anxiety    Arthritis    Complication of anesthesia    Depression    Family history of adverse reaction to anesthesia    nausea and vomiting   GERD (gastroesophageal reflux disease)    Heart murmur    as child, grew out of   Hypertension    IBS (irritable bowel syndrome)    IBS (irritable bowel syndrome)    Pneumonia    walking   PONV (postoperative nausea and vomiting)     Surgeries: Procedure(s): TOTAL HIP ARTHROPLASTY ANTERIOR APPROACH on 09/28/2022   Consultants:   Discharged Condition: Improved  Hospital Course: Joy Tucker is an 57 y.o. female who was admitted 09/28/2022 for operative treatment ofS/P total right hip arthroplasty. Patient has severe unremitting pain that affects sleep, daily activities, and work/hobbies. After pre-op clearance the patient was taken to the operating room on 09/28/2022 and underwent  Procedure(s): TOTAL HIP ARTHROPLASTY ANTERIOR APPROACH.    Patient was given perioperative antibiotics:  Anti-infectives (From admission, onward)    Start     Dose/Rate Route Frequency Ordered Stop   09/28/22 1430  ceFAZolin (ANCEF) IVPB 2g/100 mL premix        2 g 200 mL/hr over 30 Minutes Intravenous Every 6 hours 09/28/22 1154 09/29/22 1545   09/28/22 0630  ceFAZolin (ANCEF) IVPB 2g/100 mL premix        2 g 200 mL/hr over 30 Minutes Intravenous On call to O.R. 09/28/22 5366 09/28/22 4403        Patient was given sequential compression devices, early ambulation, and chemoprophylaxis to prevent DVT. Patient worked with PT and was meeting their goals regarding safe ambulation and transfers.  Patient benefited maximally from hospital stay and there were no  complications.    Recent vital signs: No data found.   Recent laboratory studies: No results for input(s): "WBC", "HGB", "HCT", "PLT", "NA", "K", "CL", "CO2", "BUN", "CREATININE", "GLUCOSE", "INR", "CALCIUM" in the last 72 hours.  Invalid input(s): "PT", "2"   Discharge Medications:   Allergies as of 09/29/2022       Reactions   Mushroom Extract Complex Anaphylaxis   Other Anaphylaxis   Mushrooms   Promethazine Hcl Other (See Comments)   Hallucinations Heart palpitations flailing of arms and legs   Latex Itching   Turns skin red        Medication List     STOP taking these medications    acetaminophen 500 MG tablet Commonly known as: TYLENOL   enoxaparin 40 MG/0.4ML injection Commonly known as: LOVENOX       TAKE these medications    ALPRAZolam 0.5 MG tablet Commonly known as: XANAX Take 0.5 mg by mouth 2 (two) times daily as needed for anxiety. Notes to patient: Last dose given 09/03 10:34pm   aspirin 81 MG chewable tablet Chew 1 tablet (81 mg total) by mouth 2 (two) times daily for 28 days.   BEANO PO Take 1 tablet by mouth as needed (for gas). Notes to patient: Resume home regimen   celecoxib 200 MG capsule Commonly known as: CeleBREX Take 1 capsule (200 mg total) by mouth 2 (two) times daily.   cetirizine 10 MG tablet Commonly known as: ZYRTEC Take 10 mg  by mouth daily as needed for allergies. Notes to patient: Resume home regimen   doxylamine (Sleep) 25 MG tablet Commonly known as: UNISOM Take 25 mg by mouth at bedtime as needed for sleep. Notes to patient: Resume home regimen   EPINEPHrine 0.3 mg/0.3 mL Devi Commonly known as: EpiPen Inject 0.3 mLs (0.3 mg total) into the muscle as needed (for severe allergic reaction). Notes to patient: Resume home regimen   fluocinonide 0.05 % external solution Commonly known as: LIDEX Apply 1 Application topically daily as needed (Eczema). Notes to patient: Resume home regimen    HYDROcodone-acetaminophen 5-325 MG tablet Commonly known as: NORCO/VICODIN Take 1 tablet by mouth every 4 (four) hours as needed for severe pain. Notes to patient: Last dose given 09/04 12:08pm   HYDROCORTISONE-ALOE VERA EX Apply 1 application  topically daily as needed (itching). Notes to patient: Resume home regimen   lisinopril-hydrochlorothiazide 10-12.5 MG tablet Commonly known as: ZESTORETIC Take 1 tablet by mouth in the morning. Notes to patient: Resume home regimen   methocarbamol 500 MG tablet Commonly known as: ROBAXIN Take 1 tablet (500 mg total) by mouth every 6 (six) hours as needed for muscle spasms. What changed:  when to take this reasons to take this Notes to patient: Last dose given 09/03 06:32pm   ondansetron 4 MG tablet Commonly known as: Zofran Take 1 tablet (4 mg total) by mouth every 8 (eight) hours as needed for nausea or vomiting. Notes to patient: Last dose given 09*04 06:51am   Pepcid 40 MG tablet Generic drug: famotidine Take 40 mg by mouth daily as needed for heartburn or indigestion. Notes to patient: Last dose given 09/04 02:20pm   polyethylene glycol 17 g packet Commonly known as: MIRALAX / GLYCOLAX Take 17 g by mouth 2 (two) times daily.   Probiotic Caps Take 1 capsule by mouth daily. Align pre-pro Notes to patient: Resume home regimen   senna 8.6 MG Tabs tablet Commonly known as: SENOKOT Take 2 tablets (17.2 mg total) by mouth at bedtime.   simethicone 125 MG chewable tablet Commonly known as: MYLICON Chew 125 mg by mouth every 6 (six) hours as needed for flatulence. Notes to patient: Resume home regimen   venlafaxine XR 37.5 MG 24 hr capsule Commonly known as: EFFEXOR-XR Take 37.5 mg by mouth See admin instructions. Take with 75 mg for a total of 112.5 mg in the morning   venlafaxine XR 75 MG 24 hr capsule Commonly known as: EFFEXOR-XR Take 75 mg by mouth See admin instructions. Take with 37.5 mg for a total of 112.5 mg in  the morning               Discharge Care Instructions  (From admission, onward)           Start     Ordered   09/29/22 0000  Change dressing       Comments: Maintain surgical dressing until follow up in the clinic. If the edges start to pull up, may reinforce with tape. If the dressing is no longer working, may remove and cover with gauze and tape, but must keep the area dry and clean.  Call with any questions or concerns.   09/29/22 0852            Diagnostic Studies: DG Pelvis Portable  Result Date: 09/28/2022 CLINICAL DATA:  Status post total right hip arthroplasty. EXAM: PORTABLE PELVIS 1-2 VIEWS COMPARISON:  CT abdomen pelvis 10/11/2012 FINDINGS: Interval total right hip arthroplasty. No perihardware lucency  is seen to indicate hardware failure or loosening. Expected postoperative changes of lateral hip subcutaneous air. Mild superior pubic symphysis degenerative spurring. Mild superomedial left femoroacetabular joint space narrowing. No acute fracture or dislocation. IMPRESSION: Interval total right hip arthroplasty without evidence of hardware failure or loosening. Electronically Signed   By: Neita Garnet M.D.   On: 09/28/2022 12:03   DG HIP UNILAT WITH PELVIS 2-3 VIEWS RIGHT  Result Date: 09/28/2022 CLINICAL DATA:  Total right hip arthroplasty. Anterior approach. Intraoperative fluoroscopy. EXAM: DG HIP (WITH OR WITHOUT PELVIS) 2-3V RIGHT COMPARISON:  CT abdomen and pelvis 10/11/2012 FINDINGS: Images were performed intraoperatively without the presence of a radiologist. Severe superior right femoroacetabular joint space narrowing. High-grade lateral uncoverage of the superior femoral head. The patient is undergoing total right hip arthroplasty. Total fluoroscopy images: 3 Total fluoroscopy time: 18 seconds Total dose: Radiation Exposure Index (as provided by the fluoroscopic device): 2.47 mGy air Kerma Please see intraoperative findings for further detail. IMPRESSION:  Intraoperative fluoroscopy for total right hip arthroplasty. Electronically Signed   By: Neita Garnet M.D.   On: 09/28/2022 12:02   DG C-Arm 1-60 Min-No Report  Result Date: 09/28/2022 Fluoroscopy was utilized by the requesting physician.  No radiographic interpretation.    Disposition: Discharge disposition: 01-Home or Self Care       Discharge Instructions     Call MD / Call 911   Complete by: As directed    If you experience chest pain or shortness of breath, CALL 911 and be transported to the hospital emergency room.  If you develope a fever above 101 F, pus (white drainage) or increased drainage or redness at the wound, or calf pain, call your surgeon's office.   Change dressing   Complete by: As directed    Maintain surgical dressing until follow up in the clinic. If the edges start to pull up, may reinforce with tape. If the dressing is no longer working, may remove and cover with gauze and tape, but must keep the area dry and clean.  Call with any questions or concerns.   Constipation Prevention   Complete by: As directed    Drink plenty of fluids.  Prune juice may be helpful.  You may use a stool softener, such as Colace (over the counter) 100 mg twice a day.  Use MiraLax (over the counter) for constipation as needed.   Diet - low sodium heart healthy   Complete by: As directed    Increase activity slowly as tolerated   Complete by: As directed    Weight bearing as tolerated with assist device (walker, cane, etc) as directed, use it as long as suggested by your surgeon or therapist, typically at least 4-6 weeks.   Post-operative opioid taper instructions:   Complete by: As directed    POST-OPERATIVE OPIOID TAPER INSTRUCTIONS: It is important to wean off of your opioid medication as soon as possible. If you do not need pain medication after your surgery it is ok to stop day one. Opioids include: Codeine, Hydrocodone(Norco, Vicodin), Oxycodone(Percocet, oxycontin) and  hydromorphone amongst others.  Long term and even short term use of opiods can cause: Increased pain response Dependence Constipation Depression Respiratory depression And more.  Withdrawal symptoms can include Flu like symptoms Nausea, vomiting And more Techniques to manage these symptoms Hydrate well Eat regular healthy meals Stay active Use relaxation techniques(deep breathing, meditating, yoga) Do Not substitute Alcohol to help with tapering If you have been on opioids for less than two  weeks and do not have pain than it is ok to stop all together.  Plan to wean off of opioids This plan should start within one week post op of your joint replacement. Maintain the same interval or time between taking each dose and first decrease the dose.  Cut the total daily intake of opioids by one tablet each day Next start to increase the time between doses. The last dose that should be eliminated is the evening dose.      TED hose   Complete by: As directed    Use stockings (TED hose) for 2 weeks on both leg(s).  You may remove them at night for sleeping.        Follow-up Information     Durene Romans, MD. Schedule an appointment as soon as possible for a visit in 2 week(s).   Specialty: Orthopedic Surgery Contact information: [address] [place] [address] [phone]                  Signed: Cassandria Anger 10/11/2022, 2:52 PM

## 2024-01-04 ENCOUNTER — Encounter (HOSPITAL_COMMUNITY): Admission: RE | Admit: 2024-01-04 | Source: Ambulatory Visit

## 2024-01-12 DIAGNOSIS — M1612 Unilateral primary osteoarthritis, left hip: Secondary | ICD-10-CM

## 2024-04-17 ENCOUNTER — Encounter (HOSPITAL_COMMUNITY): Admission: RE | Payer: Self-pay | Source: Home / Self Care

## 2024-04-17 ENCOUNTER — Ambulatory Visit (HOSPITAL_COMMUNITY): Admission: RE | Admit: 2024-04-17 | Source: Home / Self Care | Admitting: Orthopedic Surgery

## 2024-04-17 DIAGNOSIS — M1612 Unilateral primary osteoarthritis, left hip: Secondary | ICD-10-CM

## 2024-04-17 SURGERY — ARTHROPLASTY, HIP, TOTAL, ANTERIOR APPROACH
Anesthesia: Spinal | Site: Hip | Laterality: Left
